# Patient Record
Sex: Male | Born: 1985 | Race: White | Hispanic: No | Marital: Single | Smoking: Former smoker
Health system: Southern US, Community
[De-identification: ages and names within clinical notes are randomized; demographics above are authoritative.]

## PROBLEM LIST (undated history)

## (undated) DIAGNOSIS — J449 Chronic obstructive pulmonary disease, unspecified: Secondary | ICD-10-CM

## (undated) DIAGNOSIS — M25562 Pain in left knee: Secondary | ICD-10-CM

## (undated) DIAGNOSIS — G8929 Other chronic pain: Secondary | ICD-10-CM

## (undated) DIAGNOSIS — M549 Dorsalgia, unspecified: Secondary | ICD-10-CM

## (undated) DIAGNOSIS — S83209A Unspecified tear of unspecified meniscus, current injury, unspecified knee, initial encounter: Secondary | ICD-10-CM

## (undated) HISTORY — PX: HAND DEBRIDEMENT: SHX974

## (undated) HISTORY — PX: TONSILLECTOMY: SUR1361

---

## 2004-10-21 HISTORY — PX: KNEE SURGERY: SHX244

## 2006-03-09 ENCOUNTER — Emergency Department (HOSPITAL_COMMUNITY): Admission: EM | Admit: 2006-03-09 | Discharge: 2006-03-09 | Payer: Self-pay | Admitting: *Deleted

## 2007-01-18 ENCOUNTER — Emergency Department (HOSPITAL_COMMUNITY): Admission: EM | Admit: 2007-01-18 | Discharge: 2007-01-18 | Payer: Self-pay | Admitting: Emergency Medicine

## 2011-08-22 ENCOUNTER — Emergency Department (HOSPITAL_COMMUNITY): Payer: Self-pay

## 2011-08-22 ENCOUNTER — Emergency Department (HOSPITAL_COMMUNITY)
Admission: EM | Admit: 2011-08-22 | Discharge: 2011-08-22 | Disposition: A | Discharge status: Home / Self Care | Payer: Self-pay | Attending: Emergency Medicine | Admitting: Emergency Medicine

## 2011-08-22 DIAGNOSIS — J449 Chronic obstructive pulmonary disease, unspecified: Secondary | ICD-10-CM | POA: Insufficient documentation

## 2011-08-22 DIAGNOSIS — R071 Chest pain on breathing: Secondary | ICD-10-CM | POA: Insufficient documentation

## 2011-08-22 DIAGNOSIS — F17200 Nicotine dependence, unspecified, uncomplicated: Secondary | ICD-10-CM | POA: Insufficient documentation

## 2011-08-22 DIAGNOSIS — J4489 Other specified chronic obstructive pulmonary disease: Secondary | ICD-10-CM | POA: Insufficient documentation

## 2011-08-22 DIAGNOSIS — R079 Chest pain, unspecified: Secondary | ICD-10-CM | POA: Insufficient documentation

## 2012-01-02 ENCOUNTER — Emergency Department (HOSPITAL_COMMUNITY): Payer: Self-pay

## 2012-01-02 ENCOUNTER — Encounter (HOSPITAL_COMMUNITY): Payer: Self-pay | Admitting: *Deleted

## 2012-01-02 ENCOUNTER — Emergency Department (HOSPITAL_COMMUNITY)
Admission: EM | Admit: 2012-01-02 | Discharge: 2012-01-02 | Disposition: A | Discharge status: Home / Self Care | Payer: Self-pay | Attending: Emergency Medicine | Admitting: Emergency Medicine

## 2012-01-02 DIAGNOSIS — M25569 Pain in unspecified knee: Secondary | ICD-10-CM | POA: Insufficient documentation

## 2012-01-02 DIAGNOSIS — F17200 Nicotine dependence, unspecified, uncomplicated: Secondary | ICD-10-CM | POA: Insufficient documentation

## 2012-01-02 MED ORDER — NAPROXEN 500 MG PO TABS: 500.0000 mg | ORAL_TABLET | Freq: Two times a day (BID) | ORAL | Status: AC

## 2012-01-02 NOTE — Discharge Instructions (Signed)
Knee Pain The knee is the complex joint between your thigh and your lower leg. It is made up of bones, tendons, ligaments, and cartilage. The bones that make up the knee are:  The femur in the thigh.   The tibia and fibula in the lower leg.   The patella or kneecap riding in the groove on the lower femur.  CAUSES  Knee pain is a common complaint with many causes. A few of these causes are:  Injury, such as:   A ruptured ligament or tendon injury.   Torn cartilage.   Medical conditions, such as:   Gout   Arthritis   Infections   Overuse, over training or overdoing a physical activity.  Knee pain can be minor or severe. Knee pain can accompany debilitating injury. Minor knee problems often respond well to self-care measures or get well on their own. More serious injuries may need medical intervention or even surgery. SYMPTOMS The knee is complex. Symptoms of knee problems can vary widely. Some of the problems are:  Pain with movement and weight bearing.   Swelling and tenderness.   Buckling of the knee.   Inability to straighten or extend your knee.   Your knee locks and you cannot straighten it.   Warmth and redness with pain and fever.   Deformity or dislocation of the kneecap.  DIAGNOSIS  Determining what is wrong may be very straight forward such as when there is an injury. It can also be challenging because of the complexity of the knee. Tests to make a diagnosis may include:  Your caregiver taking a history and doing a physical exam.   Routine X-rays can be used to rule out other problems. X-rays will not reveal a cartilage tear. Some injuries of the knee can be diagnosed by:   Arthroscopy a surgical technique by which a small video camera is inserted through tiny incisions on the sides of the knee. This procedure is used to examine and repair internal knee joint problems. Tiny instruments can be used during arthroscopy to repair the torn knee cartilage  (meniscus).   Arthrography is a radiology technique. A contrast liquid is directly injected into the knee joint. Internal structures of the knee joint then become visible on X-ray film.   An MRI scan is a non x-ray radiology procedure in which magnetic fields and a computer produce two- or three-dimensional images of the inside of the knee. Cartilage tears are often visible using an MRI scanner. MRI scans have largely replaced arthrography in diagnosing cartilage tears of the knee.   Blood work.   Examination of the fluid that helps to lubricate the knee joint (synovial fluid). This is done by taking a sample out using a needle and a syringe.  TREATMENT The treatment of knee problems depends on the cause. Some of these treatments are:  Depending on the injury, proper casting, splinting, surgery or physical therapy care will be needed.   Give yourself adequate recovery time. Do not overuse your joints. If you begin to get sore during workout routines, back off. Slow down or do fewer repetitions.   For repetitive activities such as cycling or running, maintain your strength and nutrition.   Alternate muscle groups. For example if you are a weight lifter, work the upper body on one day and the lower body the next.   Either tight or weak muscles do not give the proper support for your knee. Tight or weak muscles do not absorb the stress placed   on the knee joint. Keep the muscles surrounding the knee strong.   Take care of mechanical problems.   If you have flat feet, orthotics or special shoes may help. See your caregiver if you need help.   Arch supports, sometimes with wedges on the inner or outer aspect of the heel, can help. These can shift pressure away from the side of the knee most bothered by osteoarthritis.   A brace called an "unloader" brace also may be used to help ease the pressure on the most arthritic side of the knee.   If your caregiver has prescribed crutches, braces,  wraps or ice, use as directed. The acronym for this is PRICE. This means protection, rest, ice, compression and elevation.   Nonsteroidal anti-inflammatory drugs (NSAID's), can help relieve pain. But if taken immediately after an injury, they may actually increase swelling. Take NSAID's with food in your stomach. Stop them if you develop stomach problems. Do not take these if you have a history of ulcers, stomach pain or bleeding from the bowel. Do not take without your caregiver's approval if you have problems with fluid retention, heart failure, or kidney problems.   For ongoing knee problems, physical therapy may be helpful.   Glucosamine and chondroitin are over-the-counter dietary supplements. Both may help relieve the pain of osteoarthritis in the knee. These medicines are different from the usual anti-inflammatory drugs. Glucosamine may decrease the rate of cartilage destruction.   Injections of a corticosteroid drug into your knee joint may help reduce the symptoms of an arthritis flare-up. They may provide pain relief that lasts a few months. You may have to wait a few months between injections. The injections do have a small increased risk of infection, water retention and elevated blood sugar levels.   Hyaluronic acid injected into damaged joints may ease pain and provide lubrication. These injections may work by reducing inflammation. A series of shots may give relief for as long as 6 months.   Topical painkillers. Applying certain ointments to your skin may help relieve the pain and stiffness of osteoarthritis. Ask your pharmacist for suggestions. Many over the-counter products are approved for temporary relief of arthritis pain.   In some countries, doctors often prescribe topical NSAID's for relief of chronic conditions such as arthritis and tendinitis. A review of treatment with NSAID creams found that they worked as well as oral medications but without the serious side effects.    PREVENTION  Maintain a healthy weight. Extra pounds put more strain on your joints.   Get strong, stay limber. Weak muscles are a common cause of knee injuries. Stretching is important. Include flexibility exercises in your workouts.   Be smart about exercise. If you have osteoarthritis, chronic knee pain or recurring injuries, you may need to change the way you exercise. This does not mean you have to stop being active. If your knees ache after jogging or playing basketball, consider switching to swimming, water aerobics or other low-impact activities, at least for a few days a week. Sometimes limiting high-impact activities will provide relief.   Make sure your shoes fit well. Choose footwear that is right for your sport.   Protect your knees. Use the proper gear for knee-sensitive activities. Use kneepads when playing volleyball or laying carpet. Buckle your seat belt every time you drive. Most shattered kneecaps occur in car accidents.   Rest when you are tired.  SEEK MEDICAL CARE IF:  You have knee pain that is continual and does not   seem to be getting better.  SEEK IMMEDIATE MEDICAL CARE IF:  Your knee joint feels hot to the touch and you have a high fever. MAKE SURE YOU:   Understand these instructions.   Will watch your condition.   Will get help right away if you are not doing well or get worse.  Document Released: 08/04/2007 Document Revised: 09/26/2011 Document Reviewed: 08/04/2007 [employer] Patient Information 2012 [employer]. 

## 2012-01-02 NOTE — ED Provider Notes (Signed)
History     CSN: [id number]  Arrival date & time 01/02/12  2218   First MD Initiated Contact with Patient 01/02/12 2230      Chief Complaint  Patient presents with  . Knee Pain    (Consider location/radiation/quality/duration/timing/severity/associated sxs/prior treatment) HPI Comments: Patient c/o increasing pain to his left knee for several days.  States that he has chronic pain in his left knee due to previous injury he suffered while in the [employer].  Also states that he had surgery to repair a torn meniscus several years ago.  Pain to his left knee is worse with weightbearing. He also states that his knee feels a" unstable" and at times will not support his weight.  He states that he has been evaluated several years ago through the [place] for the same symptoms. He requests a referral to a civilian orthopedic. He denies swelling, or numbness,  to his knee.  Patient is a 26 y.o. male presenting with knee pain. The history is provided by the patient. No language interpreter was used.  Knee Pain This is a chronic problem. The current episode started in the past 7 days. The problem occurs constantly. The problem has been gradually worsening. Associated symptoms include arthralgias. Pertinent negatives include no fever, joint swelling, neck pain, numbness, swollen glands, urinary symptoms, vomiting or weakness. The symptoms are aggravated by standing, twisting and walking. He has tried nothing for the symptoms. The treatment provided no relief.    History reviewed. No pertinent past medical history.  Past Surgical History  Procedure Date  . Tonsillectomy   . Knee surgery     History reviewed. No pertinent family history.  History  Substance Use Topics  . Smoking status: Current Everyday Smoker  . Smokeless tobacco: Not on file  . Alcohol Use: Yes      Review of Systems  Constitutional: Negative for fever.  HENT: Negative for neck pain.   Gastrointestinal: Negative for vomiting.   Musculoskeletal: Positive for arthralgias. Negative for back pain and joint swelling.  Skin: Negative.   Neurological: Negative for weakness and numbness.  All other systems reviewed and are negative.    Allergies  Tramadol  Home Medications  No current outpatient prescriptions on file.  BP 134/86  Pulse 86  Temp(Src) 97.8 F (36.6 C) (Oral)  Resp 18  Ht 6\' 1"  (1.854 m)  Wt 175 lb (79.379 kg)  BMI 23.09 kg/m2  SpO2 100%  Physical Exam  Nursing note and vitals reviewed. Constitutional: He is oriented to person, place, and time. He appears well-developed and well-nourished. No distress.  HENT:  Head: Normocephalic and atraumatic.  Cardiovascular: Normal rate, regular rhythm and normal heart sounds.   Pulmonary/Chest: Effort normal and breath sounds normal. No respiratory distress.  Musculoskeletal: He exhibits tenderness. He exhibits no edema.       Left knee: He exhibits decreased range of motion and bony tenderness. He exhibits no swelling and no effusion. tenderness found. Medial joint line tenderness noted.       Legs:      Tenderness to palpation of the left medial meniscus. No erythema, effusion, or obvious deformities of the knee patient has full range of motion of the knee.  Patella tendon appears intact  Neurological: He is alert and oriented to person, place, and time. He exhibits normal muscle tone. Coordination normal.  Skin: Skin is warm and dry.    ED Course  Procedures (including critical care time)  Dg Knee Complete 4 Views Left  01/02/2012  *RADIOLOGY REPORT*  Clinical Data: Left knee pain  LEFT KNEE - COMPLETE 4+ VIEW  Comparison: None.  Findings: No acute fracture and no dislocation.  Small joint effusion.  IMPRESSION: No acute bony injury.  Original Report Authenticated By: Donavan Burnet, M.D.     Patient has a knee immobilizer at home    MDM   Chronic pain to the left knee with increasing pain for several days.  I will give him crutches and he  requests a referral for an orthopedist.  I doubt any acute injury or infectious process  Patient / Family / Caregiver understand and agree with initial ED impression and plan with expectations set for ED visit. Pt stable in ED with no significant deterioration in condition. Pt feels improved after observation and/or treatment in ED.        Stefanee Mckell L. [place], [place] 01/02/12 2333

## 2012-01-02 NOTE — ED Notes (Signed)
Pain lt knee, injury when in [employer].  Worse today.

## 2012-01-04 NOTE — ED Provider Notes (Signed)
Medical screening examination/treatment/procedure(s) were performed by non-physician practitioner and as supervising physician I was immediately available for consultation/collaboration.   Kalaysia Demonbreun L Payton Prinsen, MD 01/04/12 0725 

## 2013-01-02 DIAGNOSIS — J4489 Other specified chronic obstructive pulmonary disease: Secondary | ICD-10-CM | POA: Insufficient documentation

## 2013-01-02 DIAGNOSIS — J449 Chronic obstructive pulmonary disease, unspecified: Secondary | ICD-10-CM | POA: Insufficient documentation

## 2013-01-02 DIAGNOSIS — Z9889 Other specified postprocedural states: Secondary | ICD-10-CM | POA: Insufficient documentation

## 2013-01-02 DIAGNOSIS — M25569 Pain in unspecified knee: Secondary | ICD-10-CM | POA: Insufficient documentation

## 2013-01-02 DIAGNOSIS — F17200 Nicotine dependence, unspecified, uncomplicated: Secondary | ICD-10-CM | POA: Insufficient documentation

## 2013-01-02 DIAGNOSIS — Z87828 Personal history of other (healed) physical injury and trauma: Secondary | ICD-10-CM | POA: Insufficient documentation

## 2013-01-03 ENCOUNTER — Emergency Department (HOSPITAL_COMMUNITY): Payer: Non-veteran care

## 2013-01-03 ENCOUNTER — Encounter (HOSPITAL_COMMUNITY): Payer: Self-pay | Admitting: Emergency Medicine

## 2013-01-03 ENCOUNTER — Emergency Department (HOSPITAL_COMMUNITY)
Admission: EM | Admit: 2013-01-03 | Discharge: 2013-01-03 | Disposition: A | Discharge status: Home / Self Care | Payer: Non-veteran care | Attending: Emergency Medicine | Admitting: Emergency Medicine

## 2013-01-03 HISTORY — DX: Chronic obstructive pulmonary disease, unspecified: J44.9

## 2013-01-03 MED ORDER — HYDROCODONE-ACETAMINOPHEN 5-325 MG PO TABS: 2.0000 | ORAL_TABLET | Freq: Four times a day (QID) | ORAL | Status: DC | PRN
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
## 2013-01-04 NOTE — ED Provider Notes (Signed)
Medical screening examination/treatment/procedure(s) were performed by non-physician practitioner and as supervising physician I was immediately available for consultation/collaboration.  [name], MD 01/04/13 [phone]

## 2013-03-02 ENCOUNTER — Emergency Department (HOSPITAL_COMMUNITY)
Admission: EM | Admit: 2013-03-02 | Discharge: 2013-03-02 | Disposition: A | Discharge status: Home / Self Care | Payer: Non-veteran care | Attending: Emergency Medicine | Admitting: Emergency Medicine

## 2013-03-02 ENCOUNTER — Encounter (HOSPITAL_COMMUNITY): Payer: Self-pay

## 2013-03-02 DIAGNOSIS — M549 Dorsalgia, unspecified: Secondary | ICD-10-CM

## 2013-03-02 DIAGNOSIS — J449 Chronic obstructive pulmonary disease, unspecified: Secondary | ICD-10-CM | POA: Insufficient documentation

## 2013-03-02 DIAGNOSIS — G8929 Other chronic pain: Secondary | ICD-10-CM | POA: Insufficient documentation

## 2013-03-02 DIAGNOSIS — F17200 Nicotine dependence, unspecified, uncomplicated: Secondary | ICD-10-CM | POA: Insufficient documentation

## 2013-03-02 DIAGNOSIS — J4489 Other specified chronic obstructive pulmonary disease: Secondary | ICD-10-CM | POA: Insufficient documentation

## 2013-03-02 HISTORY — DX: Other chronic pain: G89.29

## 2013-03-02 HISTORY — DX: Dorsalgia, unspecified: M54.9

## 2013-03-02 MED ORDER — DIAZEPAM 5 MG PO TABS: 5.0000 mg | ORAL_TABLET | Freq: Two times a day (BID) | ORAL | Status: DC

## 2013-03-02 MED ORDER — PREDNISONE 20 MG PO TABS: ORAL_TABLET | ORAL | Status: DC

## 2013-03-02 MED ORDER — PREDNISONE 20 MG PO TABS
60.0000 mg | ORAL_TABLET | Freq: Once | ORAL | Status: AC
  Administered 2013-03-02: 60 mg via ORAL
  Filled 2013-03-02: qty 3

## 2013-03-02 NOTE — ED Provider Notes (Signed)
History    This chart was scribed for non-physician practitioner Johnnette Gourd working with Celene Kras, MD by Quintella Reichert, ED Scribe. This patient was seen in room WTR5/WTR5 and the patient's care was started at 6:48 PM .   CSN: [id number]  Arrival date & time 03/02/13  1836      Chief Complaint  Patient presents with  . Back Pain     The history is provided by the patient. No language interpreter was used.    HPI Comments: Dashon Mcintire is a 27 y.o. male who presents to the Emergency Department complaining of constant, gradually-worsening, moderate lower back pain that began in 2011 but became much more severe today.  Pt states he injured his back several years ago while working, and that current episode of increased pain began while pt was driving 5 hours ago.  He denies any recent traumas or falls. He states that pain is generalized throughout the lower back and describes it as a non-radiating sharp and dull pain at a severity of 7/10.  Pain is exacerbated by "anything".  He has not attempted to treat symptoms at home.  Pt denies numbness or tingling in legs, fever, chills, or any other associated symptoms.  He states pain feels similar to pain subsequent to prior injury but more severe.  Pt states he has had several flare-ups in the past several years but has not sought treatment.  He denies past h/o IV drug abuse.  Pt states that he drove himself to the ED.  Pt does not have PCP but goes to the [place] for regular healthcare.  Past Medical History  Diagnosis Date  . COPD (chronic obstructive pulmonary disease)   . Back pain, chronic     Past Surgical History  Procedure Laterality Date  . Tonsillectomy    . Knee surgery    . Hand debridement      No family history on file.  History  Substance Use Topics  . Smoking status: Current Every Day Smoker -- 1.00 packs/day for 15 years    Types: Cigarettes  . Smokeless tobacco: Former Neurosurgeon  . Alcohol Use: No      Review of  Systems  Constitutional: Negative for fever and chills.  Musculoskeletal: Positive for back pain. Negative for gait problem.  All other systems reviewed and are negative.    Allergies  Tramadol  Home Medications   Current Outpatient Rx  Name  Route  Sig  Dispense  Refill  . cyclobenzaprine (FLEXERIL) 10 MG tablet   Oral   Take 10 mg by mouth 3 (three) times daily as needed for muscle spasms.         Marland Kitchen HYDROcodone-acetaminophen (NORCO/VICODIN) 5-325 MG per tablet   Oral   Take 2 tablets by mouth every 6 (six) hours as needed for pain.   12 tablet   0   . ibuprofen (ADVIL,MOTRIN) 200 MG tablet   Oral   Take 400 mg by mouth every 6 (six) hours as needed for pain.           BP 123/76  Pulse 96  Temp(Src) 98.8 F (37.1 C)  Resp 16  SpO2 96%  Physical Exam  Nursing note and vitals reviewed. Constitutional: He is oriented to person, place, and time. He appears well-developed and well-nourished. No distress.  HENT:  Head: Normocephalic and atraumatic.  Eyes: Conjunctivae and EOM are normal.  Neck: Normal range of motion. Neck supple.  Cardiovascular: Normal rate, regular rhythm and normal  heart sounds.   Pulmonary/Chest: Effort normal and breath sounds normal.  Musculoskeletal: Normal range of motion. He exhibits tenderness. He exhibits no edema.  Tenderness throughout the entire lower back.  Neurological: He is alert and oriented to person, place, and time.  Strength 5/5 lower extremities bilaterally. Sensation intact. Normal gait.  Skin: Skin is warm and dry.  Psychiatric: He has a normal mood and affect. His behavior is normal.    ED Course  Procedures (including critical care time)  DIAGNOSTIC STUDIES: Oxygen Saturation is 96% on room air, normal by my interpretation.    COORDINATION OF CARE: 6:52 PM-Discussed treatment plan which includes prescription for muscle relaxers and prednisone with pt at bedside and pt agreed to plan. Informed pt that he will  not get a dose of muscle relaxer in the ED due to him having to drive himself home. Addressed symptoms to return for and [facility] otherwise.     Labs Reviewed - No data to display No results found.   1. Back pain       MDM  27 year old male with back pain. No red flags concerning patient's back pain. No focal neurologic deficits. Ambulating without difficulty. No signs of cauda equina. Afebrile and in no apparent distress. To place him on a short course of prednisone along with Valium. Return precautions discussed. Resource guide given for a PCP followup. Patient states understanding of plan and is agreeable.    I personally performed the services described in this documentation, which was scribed in my presence. The recorded information has been reviewed and is accurate.    Trevor Mace, PA-C 03/02/13 1906

## 2013-03-02 NOTE — ED Notes (Signed)
Pt presents with no acute distress.  Denies injury- reports "holding daughter".   Lower back pain denies numbness and tingling.

## 2013-03-03 NOTE — ED Provider Notes (Signed)
Medical screening examination/treatment/procedure(s) were performed by non-physician practitioner and as supervising physician I was immediately available for consultation/collaboration.    Tarence Searcy R Tawania Daponte, MD 03/03/13 0017 

## 2013-05-09 ENCOUNTER — Encounter (HOSPITAL_COMMUNITY): Payer: Self-pay | Admitting: Emergency Medicine

## 2013-05-09 ENCOUNTER — Emergency Department (HOSPITAL_COMMUNITY): Payer: Non-veteran care

## 2013-05-09 ENCOUNTER — Emergency Department (HOSPITAL_COMMUNITY)
Admission: EM | Admit: 2013-05-09 | Discharge: 2013-05-09 | Disposition: A | Discharge status: Home / Self Care | Payer: Non-veteran care | Attending: Emergency Medicine | Admitting: Emergency Medicine

## 2013-05-09 DIAGNOSIS — R5381 Other malaise: Secondary | ICD-10-CM

## 2013-05-09 DIAGNOSIS — R42 Dizziness and giddiness: Secondary | ICD-10-CM | POA: Insufficient documentation

## 2013-05-09 DIAGNOSIS — R5383 Other fatigue: Secondary | ICD-10-CM

## 2013-05-09 DIAGNOSIS — G8929 Other chronic pain: Secondary | ICD-10-CM | POA: Insufficient documentation

## 2013-05-09 DIAGNOSIS — M791 Myalgia, unspecified site: Secondary | ICD-10-CM

## 2013-05-09 DIAGNOSIS — R531 Weakness: Secondary | ICD-10-CM

## 2013-05-09 DIAGNOSIS — Z87891 Personal history of nicotine dependence: Secondary | ICD-10-CM | POA: Insufficient documentation

## 2013-05-09 DIAGNOSIS — M549 Dorsalgia, unspecified: Secondary | ICD-10-CM | POA: Insufficient documentation

## 2013-05-09 DIAGNOSIS — H538 Other visual disturbances: Secondary | ICD-10-CM | POA: Insufficient documentation

## 2013-05-09 DIAGNOSIS — R259 Unspecified abnormal involuntary movements: Secondary | ICD-10-CM

## 2013-05-09 DIAGNOSIS — R251 Tremor, unspecified: Secondary | ICD-10-CM

## 2013-05-09 DIAGNOSIS — J449 Chronic obstructive pulmonary disease, unspecified: Secondary | ICD-10-CM | POA: Insufficient documentation

## 2013-05-09 DIAGNOSIS — R269 Unspecified abnormalities of gait and mobility: Secondary | ICD-10-CM

## 2013-05-09 DIAGNOSIS — IMO0001 Reserved for inherently not codable concepts without codable children: Secondary | ICD-10-CM | POA: Insufficient documentation

## 2013-05-09 DIAGNOSIS — J4489 Other specified chronic obstructive pulmonary disease: Secondary | ICD-10-CM | POA: Insufficient documentation

## 2013-05-09 LAB — CBC WITH DIFFERENTIAL/PLATELET
Basophils Relative: 0 % (ref 0–1)
Eosinophils Absolute: 0.1 10*3/uL (ref 0.0–0.7)
Eosinophils Relative: 1 % (ref 0–5)
Hemoglobin: 16.2 g/dL (ref 13.0–17.0)
Lymphs Abs: 2.3 10*3/uL (ref 0.7–4.0)
MCHC: 35.8 g/dL (ref 30.0–36.0)
Monocytes Absolute: 0.5 10*3/uL (ref 0.1–1.0)
Neutro Abs: 6.2 10*3/uL (ref 1.7–7.7)
Platelets: 238 10*3/uL (ref 150–400)
RDW: 12.9 % (ref 11.5–15.5)
WBC: 9.1 10*3/uL (ref 4.0–10.5)

## 2013-05-09 LAB — POCT I-STAT, CHEM 8
BUN: 5 mg/dL — ABNORMAL LOW (ref 6–23)
Calcium, Ion: 1.17 mmol/L (ref 1.12–1.23)
Creatinine, Ser: 1 mg/dL (ref 0.50–1.35)
Hemoglobin: 16 g/dL (ref 13.0–17.0)
Sodium: 141 mEq/L (ref 135–145)
TCO2: 24 mmol/L (ref 0–100)

## 2013-05-09 LAB — RAPID URINE DRUG SCREEN, HOSP PERFORMED
Barbiturates: NOT DETECTED
Benzodiazepines: NOT DETECTED
Opiates: NOT DETECTED
Tetrahydrocannabinol: NOT DETECTED

## 2013-05-09 LAB — GLUCOSE, CAPILLARY: Glucose-Capillary: 116 mg/dL — ABNORMAL HIGH (ref 70–99)

## 2013-05-09 LAB — URINALYSIS, ROUTINE W REFLEX MICROSCOPIC
Hgb urine dipstick: NEGATIVE
Ketones, ur: NEGATIVE mg/dL
Protein, ur: NEGATIVE mg/dL

## 2013-05-09 LAB — BASIC METABOLIC PANEL
BUN: 7 mg/dL (ref 6–23)
CO2: 26 mEq/L (ref 19–32)
Calcium: 9.5 mg/dL (ref 8.4–10.5)
GFR calc non Af Amer: >90 mL/min (ref >90)
Glucose, Bld: 116 mg/dL — ABNORMAL HIGH (ref 70–99)

## 2013-05-09 MED ORDER — GADOBENATE DIMEGLUMINE 529 MG/ML IV SOLN
15.0000 mL | Freq: Once | INTRAVENOUS | Status: AC | PRN
  Administered 2013-05-09: 15 mL via INTRAVENOUS

## 2013-05-09 MED ORDER — MORPHINE SULFATE 4 MG/ML IJ SOLN
4.0000 mg | Freq: Once | INTRAMUSCULAR | Status: AC
  Administered 2013-05-09: 4 mg via INTRAVENOUS
  Filled 2013-05-09: qty 1

## 2013-05-09 NOTE — ED Provider Notes (Signed)
Complains of generalized weakness and generalized pain onset one hour ago suddenly. Also feels tremulous. Has had similar episodes in the past, etiology unclear. States he had workup for similar symptoms several years ago no cause found. Denies illicit drug use. On exam patient mildly anxious alert Glasgow Coma Score 15 gait normal Romberg normal pronator drift normal finger-nose hasn't had to tremor with left hand unable to perform her nose normal with right hand. DTRs symmetric bilaterally knee jerk ankle jerk biceps toes are red bilaterally  Doug Sou, MD 05/09/13 1539

## 2013-05-09 NOTE — ED Notes (Signed)
Pt states he was eating lunch and began to have some generalized weakness, and shaking. Pt states he had this before in 2006 and is unsure of what the cause was. Pt is alert and oriented. Denies any hx of anxiety.

## 2013-05-09 NOTE — ED Notes (Signed)
Pt c/o general weakness and shaking that started around 66mins-1hr ago. Pt states he was eating and he just began to feel weak and shake. Pt states he has had an episode like this before in 2006 and was seen in an emergency room in [place] and is unsure as to what the cause was then. Pt states he hurts all over rates pain 4/10.

## 2013-05-09 NOTE — Consult Note (Signed)
NEURO HOSPITALIST CONSULT NOTE    Reason for Consult: left sided>right sided weakness, tremors, left greater than right sided pain, imbalance  HPI:                                                                                                                                          Mitchell Mccoy is an 27 y.o. male with a past medical history significant for COPD, chronic back pain, comes in today complaining of acute onset generalized, left greater than right sided weakness and pain, unsteadiness, and bilateral upper extremity tremors. Stated that those symptoms developed abruptly around 2 pm today. No associated headache, vertigo, double vision, confusion, slurred speech, language or vision impairment. No bladder or stool incontinence. No recent fever, infection, or head trauma. Of importance, he said that he had similar but less intense symptoms in 2006 and had CT brain that was unremarkable.   Past Medical History  Diagnosis Date  . COPD (chronic obstructive pulmonary disease)   . Back pain, chronic     Past Surgical History  Procedure Laterality Date  . Tonsillectomy    . Knee surgery    . Hand debridement      No family history on file.  Family History: non contributory   Social History:  reports that he quit smoking about 2 weeks ago. His smoking use included Cigarettes. He has a 15 pack-year smoking history. He has quit using smokeless tobacco. He reports that  drinks alcohol. He reports that he does not use illicit drugs.  Allergies  Allergen Reactions  . Tramadol Swelling    MEDICATIONS:                                                                                                                     I have reviewed the patient's current medications.   ROS:  History obtained from the patient  General ROS:  negative for - chills, fatigue, fever, night sweats, weight gain or weight loss Psychological ROS: negative for - behavioral disorder, hallucinations, memory difficulties, mood swings or suicidal ideation Ophthalmic ROS: negative for - blurry vision, double vision, eye pain or loss of vision ENT ROS: negative for - epistaxis, nasal discharge, oral lesions, sore throat, tinnitus or vertigo Allergy and Immunology ROS: negative for - hives or itchy/watery eyes Hematological and Lymphatic ROS: negative for - bleeding problems, bruising or swollen lymph nodes Endocrine ROS: negative for - galactorrhea, hair pattern changes, polydipsia/polyuria or temperature intolerance Respiratory ROS: negative for - cough, hemoptysis, shortness of breath or wheezing Cardiovascular ROS: negative for - chest pain, dyspnea on exertion, edema or irregular heartbeat Gastrointestinal ROS: negative for - abdominal pain, diarrhea, hematemesis, nausea/vomiting or stool incontinence Genito-Urinary ROS: negative for - dysuria, hematuria, incontinence or urinary frequency/urgency Musculoskeletal ROS: negative for - joint swelling or muscular weakness Neurological ROS: as noted in HPI Dermatological ROS: negative for rash and skin lesion changes    Physical exam: pleasant male in no apparent distress. Blood pressure 126/76, pulse 63, temperature 98.7 F (37.1 C), temperature source Oral, resp. rate 13, SpO2 99.00%. Head: normocephalic. Neck: supple, no bruits, no JVD. Cardiac: no murmurs. Lungs: clear. Abdomen: soft, no tender, no mass. Extremities: no edema.  Neurologic Examination:                                                                                                      Mental Status: Alert, oriented, thought content appropriate.  Speech fluent without evidence of aphasia.  Able to follow 3 step commands without difficulty. Cranial Nerves: II: Discs flat bilaterally; Visual fields grossly normal, pupils  equal, round, reactive to light and accommodation III,IV, VI: ptosis not present, extra-ocular motions intact bilaterally V,VII: smile symmetric, facial light touch sensation normal bilaterally VIII: hearing normal bilaterally IX,X: gag reflex present XI: bilateral shoulder shrug XII: midline tongue extension Motor: Right : Upper extremity   5/5    Left:     Upper extremity   4+/5 but no full effort  Lower extremity   5/5     Lower extremity   5/5 Tone and bulk:normal tone throughout; no atrophy noted Sensory: Pinprick and light touch intact throughout, bilaterally Deep Tendon Reflexes:  Brisk all over  Plantars: Right: downgoing   Left: downgoing Cerebellar: normal finger-to-nose,  normal heel-to-shin test Gait: No ataxia CV: pulses palpable throughout    No results found for this basename: cbc, bmp, coags, chol, tri, ldl, hga1c    Results for orders placed during the hospital encounter of 05/09/13 (from the past 48 hour(s))  GLUCOSE, CAPILLARY     Status: Abnormal   Collection Time    05/09/13  3:25 PM      Result Value Range   Glucose-Capillary 116 (*) 70 - 99 mg/dL  POCT I-STAT, CHEM 8     Status: Abnormal   Collection Time    05/09/13  3:42 PM      Result Value Range   Sodium  141  135 - 145 mEq/L   Potassium 3.5  3.5 - 5.1 mEq/L   Chloride 103  96 - 112 mEq/L   BUN 5 (*) 6 - 23 mg/dL   Creatinine, Ser 1.30  0.50 - 1.35 mg/dL   Glucose, Bld 865 (*) 70 - 99 mg/dL   Calcium, Ion 7.84  6.96 - 1.23 mmol/L   TCO2 24  0 - 100 mmol/L   Hemoglobin 16.0  13.0 - 17.0 g/dL   HCT 29.5  28.4 - 13.2 %  CBC WITH DIFFERENTIAL     Status: None   Collection Time    05/09/13  3:52 PM      Result Value Range   WBC 9.1  4.0 - 10.5 K/uL   RBC 5.15  4.22 - 5.81 MIL/uL   Hemoglobin 16.2  13.0 - 17.0 g/dL   HCT 44.0  10.2 - 72.5 %   MCV 87.8  78.0 - 100.0 fL   MCH 31.5  26.0 - 34.0 pg   MCHC 35.8  30.0 - 36.0 g/dL   RDW 36.6  44.0 - 34.7 %   Platelets 238  150 - 400 K/uL    Neutrophils Relative % 67  43 - 77 %   Neutro Abs 6.2  1.7 - 7.7 K/uL   Lymphocytes Relative 25  12 - 46 %   Lymphs Abs 2.3  0.7 - 4.0 K/uL   Monocytes Relative 6  3 - 12 %   Monocytes Absolute 0.5  0.1 - 1.0 K/uL   Eosinophils Relative 1  0 - 5 %   Eosinophils Absolute 0.1  0.0 - 0.7 K/uL   Basophils Relative 0  0 - 1 %   Basophils Absolute 0.0  0.0 - 0.1 K/uL  BASIC METABOLIC PANEL     Status: Abnormal   Collection Time    05/09/13  3:52 PM      Result Value Range   Sodium 138  135 - 145 mEq/L   Potassium 3.4 (*) 3.5 - 5.1 mEq/L   Chloride 103  96 - 112 mEq/L   CO2 26  19 - 32 mEq/L   Glucose, Bld 116 (*) 70 - 99 mg/dL   BUN 7  6 - 23 mg/dL   Creatinine, Ser 4.25  0.50 - 1.35 mg/dL   Calcium 9.5  8.4 - 95.6 mg/dL   GFR calc non Af Amer >90  >90 mL/min   GFR calc Af Amer >90  >90 mL/min   Comment:            The eGFR has been calculated     using the CKD EPI equation.     This calculation has not been     validated in all clinical     situations.     eGFR's persistently     <90 mL/min signify     possible Chronic Kidney Disease.    No results found.   Assessment/Plan: 27 y/o with new onset generalized weakness and pain that he describes as mainly involving the left side, tremors, and imbalance. Neuro-exam with mild weakness left arm (but afraid he is not giving me full effort) and subtle bilateral postural tremors. Can not localized his syndrome to a specific segment of the neuro-axis, and thus will suggest MRI brain with and without contrast looking for the possibility of a multifocal process. He can eventually go home if MRI negative, with outpatient neurology follow up.   Wyatt Portela, MD [facility] [phone]  05/09/2013,  5:09 PM

## 2013-05-09 NOTE — ED Provider Notes (Signed)
History    CSN: [id number] Arrival date & time 05/09/13  1435  First MD Initiated Contact with Patient 05/09/13 1523     Chief Complaint  Patient presents with  . Weakness  . Shaking   (Consider location/radiation/quality/duration/timing/severity/associated sxs/prior Treatment) HPI Comments: Pt w/ hx of COPD now w/ acute onset generalized weakness, myalgias, blurred vision and tremor. States above sx began after eating dinner approx 1hr pta. Hx of similar episode in 2006 - workup at that time unremarkable. + hx of MHA - had multiple negative head CT in past. Weakness and tremor worse in left hand. Denies HA, fever, recent infection, trauma, drug abuse, ETOH. Pt is in [employer]. Generalized myalgia 4/10. Denies other sx.   Patient is a 27 y.o. male presenting with general illness. The history is provided by the patient. No language interpreter was used.  Illness Location:  CNS Quality:  Myalgia, weakness, tremor Severity:  Moderate Onset quality:  Sudden Duration:  1 hour Timing:  Constant Progression:  Worsening Chronicity:  Recurrent Associated symptoms: myalgias   Associated symptoms: no abdominal pain, no chest pain, no congestion, no cough, no diarrhea, no fever, no headaches, no nausea, no rash, no shortness of breath, no sore throat and no vomiting    Past Medical History  Diagnosis Date  . COPD (chronic obstructive pulmonary disease)   . Back pain, chronic    Past Surgical History  Procedure Laterality Date  . Tonsillectomy    . Knee surgery    . Hand debridement     No family history on file. History  Substance Use Topics  . Smoking status: Former Smoker -- 1.00 packs/day for 15 years    Types: Cigarettes    Quit date: 04/25/2013  . Smokeless tobacco: Former Neurosurgeon  . Alcohol Use: Yes     Comment: occasional     Review of Systems  Constitutional: Negative for fever and chills.  HENT: Negative for congestion and sore throat.   Eyes: Positive for visual  disturbance.  Respiratory: Negative for cough and shortness of breath.   Cardiovascular: Negative for chest pain and leg swelling.  Gastrointestinal: Negative for nausea, vomiting, abdominal pain, diarrhea and constipation.  Genitourinary: Negative for dysuria and frequency.  Musculoskeletal: Positive for myalgias.  Skin: Negative for color change and rash.  Neurological: Positive for dizziness, tremors and weakness. Negative for headaches.  Psychiatric/Behavioral: Negative for confusion and agitation.  All other systems reviewed and are negative.    Allergies  Tramadol  Home Medications   Current Outpatient Rx  Name  Route  Sig  Dispense  Refill  . ibuprofen (ADVIL,MOTRIN) 200 MG tablet   Oral   Take 400 mg by mouth every 6 (six) hours as needed for pain.          BP 127/79  Pulse 95  Temp(Src) 98.7 F (37.1 C) (Oral)  Resp 14  SpO2 100% Physical Exam  Constitutional: He is oriented to person, place, and time. He appears well-developed and well-nourished. No distress.  HENT:  Head: Normocephalic and atraumatic.  Eyes: EOM are normal. Pupils are equal, round, and reactive to light.  Neck: Normal range of motion. Neck supple.  Cardiovascular: Normal rate and regular rhythm.   Pulmonary/Chest: Effort normal. No respiratory distress.  Abdominal: Soft. He exhibits no distension.  Musculoskeletal: Normal range of motion. He exhibits no edema.  Neurological: He is alert and oriented to person, place, and time. No cranial nerve deficit or sensory deficit. He exhibits abnormal muscle tone.  He displays a negative Romberg sign. Coordination and gait normal. GCS eye subscore is 4. GCS verbal subscore is 5. GCS motor subscore is 6. He displays no Babinski's sign on the right side. He displays no Babinski's sign on the left side.  Reflex Scores:      Patellar reflexes are 2+ on the right side and 2+ on the left side.      Achilles reflexes are 2+ on the right side and 2+ on the left  side. 4/5 left grip strength Intention tremor left finger to nose Subjective blurred vision left inferior temporal vision field  Skin: Skin is warm and dry.  Psychiatric: He has a normal mood and affect. His behavior is normal.    ED Course  Procedures (including critical care time) Results for orders placed during the hospital encounter of 05/09/13  URINALYSIS, ROUTINE W REFLEX MICROSCOPIC      Result Value Range   Color, Urine YELLOW  YELLOW   APPearance CLEAR  CLEAR   Specific Gravity, Urine 1.021  1.005 - 1.030   pH 6.0  5.0 - 8.0   Glucose, UA NEGATIVE  NEGATIVE mg/dL   Hgb urine dipstick NEGATIVE  NEGATIVE   Bilirubin Urine SMALL (*) NEGATIVE   Ketones, ur NEGATIVE  NEGATIVE mg/dL   Protein, ur NEGATIVE  NEGATIVE mg/dL   Urobilinogen, UA 1.0  0.0 - 1.0 mg/dL   Nitrite NEGATIVE  NEGATIVE   Leukocytes, UA SMALL (*) NEGATIVE  CBC WITH DIFFERENTIAL      Result Value Range   WBC 9.1  4.0 - 10.5 K/uL   RBC 5.15  4.22 - 5.81 MIL/uL   Hemoglobin 16.2  13.0 - 17.0 g/dL   HCT 91.4  78.2 - 95.6 %   MCV 87.8  78.0 - 100.0 fL   MCH 31.5  26.0 - 34.0 pg   MCHC 35.8  30.0 - 36.0 g/dL   RDW 21.3  08.6 - 57.8 %   Platelets 238  150 - 400 K/uL   Neutrophils Relative % 67  43 - 77 %   Neutro Abs 6.2  1.7 - 7.7 K/uL   Lymphocytes Relative 25  12 - 46 %   Lymphs Abs 2.3  0.7 - 4.0 K/uL   Monocytes Relative 6  3 - 12 %   Monocytes Absolute 0.5  0.1 - 1.0 K/uL   Eosinophils Relative 1  0 - 5 %   Eosinophils Absolute 0.1  0.0 - 0.7 K/uL   Basophils Relative 0  0 - 1 %   Basophils Absolute 0.0  0.0 - 0.1 K/uL  BASIC METABOLIC PANEL      Result Value Range   Sodium 138  135 - 145 mEq/L   Potassium 3.4 (*) 3.5 - 5.1 mEq/L   Chloride 103  96 - 112 mEq/L   CO2 26  19 - 32 mEq/L   Glucose, Bld 116 (*) 70 - 99 mg/dL   BUN 7  6 - 23 mg/dL   Creatinine, Ser 4.69  0.50 - 1.35 mg/dL   Calcium 9.5  8.4 - 62.9 mg/dL   GFR calc non Af Amer >90  >90 mL/min   GFR calc Af Amer >90  >90 mL/min   URINE RAPID DRUG SCREEN (HOSP PERFORMED)      Result Value Range   Opiates NONE DETECTED  NONE DETECTED   Cocaine NONE DETECTED  NONE DETECTED   Benzodiazepines NONE DETECTED  NONE DETECTED   Amphetamines NONE DETECTED  NONE DETECTED  Tetrahydrocannabinol NONE DETECTED  NONE DETECTED   Barbiturates NONE DETECTED  NONE DETECTED  GLUCOSE, CAPILLARY      Result Value Range   Glucose-Capillary 116 (*) 70 - 99 mg/dL  URINE MICROSCOPIC-ADD ON      Result Value Range   Squamous Epithelial / LPF RARE  RARE   WBC, UA 3-6  <3 WBC/hpf   RBC / HPF 0-2  <3 RBC/hpf   Bacteria, UA FEW (*) RARE   Urine-Other MUCOUS PRESENT    POCT I-STAT, CHEM 8      Result Value Range   Sodium 141  135 - 145 mEq/L   Potassium 3.5  3.5 - 5.1 mEq/L   Chloride 103  96 - 112 mEq/L   BUN 5 (*) 6 - 23 mg/dL   Creatinine, Ser 9.14  0.50 - 1.35 mg/dL   Glucose, Bld 782 (*) 70 - 99 mg/dL   Calcium, Ion 9.56  2.13 - 1.23 mmol/L   TCO2 24  0 - 100 mmol/L   Hemoglobin 16.0  13.0 - 17.0 g/dL   HCT 08.6  57.8 - 46.9 %    Mr Laqueta Jean Wo Contrast  05/09/2013   *RADIOLOGY REPORT*  Clinical Data: 27 year old male with acute onset of general weakness, muscle soreness.  Unexplained shaking.  MRI HEAD WITHOUT AND WITH CONTRAST  Technique:  Multiplanar, multiecho pulse sequences of the brain and surrounding structures were obtained according to standard protocol without and with intravenous contrast  Contrast: 15mL MULTIHANCE GADOBENATE DIMEGLUMINE 529 MG/ML IV SOLN  Comparison: None.  Findings: Study is mildly degraded by motion artifact despite repeated imaging attempts.  Normal cerebral volume. No restricted diffusion to suggest acute infarction.  No midline shift, mass effect, evidence of mass lesion, ventriculomegaly, extra-axial collection or acute intracranial hemorrhage.  Cervicomedullary junction and pituitary are within normal limits.  Negative visualized cervical spine. Major intracranial vascular flow voids are preserved.  Wallace Cullens and white matter signal is within normal limits throughout the brain. No abnormal enhancement identified.  Visualized orbit soft tissues are within normal limits.  Right maxillary sinus mucous retention cyst.  Other visualized paranasal sinuses and mastoids are clear.  Small nasopharyngeal Thornwaldt cyst.  Negative scalp soft tissues. Visualized bone marrow signal is within normal limits.  IMPRESSION: Normal MRI appearance of the brain.   Original Report Authenticated By: Erskine Speed, M.D.   1. Weakness   2. Myalgia   3. Tremor     MDM  Exam as above, NAD, neg romberg and pronator drift.  4/5 strength w/ hand grip, tremor w/ left finger/nose, subjective blurred vision left inferior temporal field. Exam not c/w focal neuro lesion MR obtained to eval for multifocal lesion - scan is normal. Neurology consulted - per their note - no acute intervention needed - can follow up outpt. Labs unremarkable, UDS negative, u/a negative for infection, CBC and BMP unremarkable, glucose normal. At this time doubt CVA/TIA/SAH, infectious or metabolic etiology, hx and exam not c/w toxidrome and pt denies ingestion. Afebrile, well appearing, stable for d/c home. Follow up w/ neurology outpt. Given return precautions. D/c in good condition.   I have personally reviewed labs and imaging and considered in my MDM. Case d/w Dr Ethelda Chick  1. Weakness   2. Myalgia   3. Tremor    [facility]     [address] [place] [address]   As needed if symptoms worsen  [facility] [address]  [place] [address] [phone] Schedule an appointment as soon as possible for a visit     Audelia Hives, MD 05/10/13 [phone]

## 2013-05-09 NOTE — ED Notes (Signed)
Pt transported to MRI 

## 2013-05-09 NOTE — ED Notes (Signed)
Discharge and follow up instructions reviewed. Pt verbalized understanding.  

## 2013-05-09 NOTE — ED Notes (Signed)
Dr Hundley at bedside  

## 2013-05-10 NOTE — ED Provider Notes (Signed)
I have personally seen and examined the patient.  I have discussed the plan of care with the resident.  I have reviewed the documentation on PMH/FH/Soc. History.  I have reviewed the documentation of the resident and agree.  Sally Menard, MD 05/10/13 0148 

## 2013-05-11 LAB — URINE CULTURE: Colony Count: NO GROWTH

## 2013-08-30 ENCOUNTER — Encounter (HOSPITAL_COMMUNITY): Payer: Self-pay | Admitting: Emergency Medicine

## 2013-08-30 ENCOUNTER — Emergency Department (HOSPITAL_COMMUNITY)
Admission: EM | Admit: 2013-08-30 | Discharge: 2013-08-30 | Disposition: A | Discharge status: Home / Self Care | Payer: Non-veteran care | Attending: Emergency Medicine | Admitting: Emergency Medicine

## 2013-08-30 ENCOUNTER — Emergency Department (HOSPITAL_COMMUNITY): Payer: Non-veteran care

## 2013-08-30 DIAGNOSIS — J449 Chronic obstructive pulmonary disease, unspecified: Secondary | ICD-10-CM | POA: Insufficient documentation

## 2013-08-30 DIAGNOSIS — Z87891 Personal history of nicotine dependence: Secondary | ICD-10-CM | POA: Insufficient documentation

## 2013-08-30 DIAGNOSIS — R296 Repeated falls: Secondary | ICD-10-CM | POA: Insufficient documentation

## 2013-08-30 DIAGNOSIS — M549 Dorsalgia, unspecified: Secondary | ICD-10-CM | POA: Insufficient documentation

## 2013-08-30 DIAGNOSIS — J4489 Other specified chronic obstructive pulmonary disease: Secondary | ICD-10-CM | POA: Insufficient documentation

## 2013-08-30 DIAGNOSIS — G8929 Other chronic pain: Secondary | ICD-10-CM | POA: Insufficient documentation

## 2013-08-30 DIAGNOSIS — Y9301 Activity, walking, marching and hiking: Secondary | ICD-10-CM | POA: Insufficient documentation

## 2013-08-30 DIAGNOSIS — Y929 Unspecified place or not applicable: Secondary | ICD-10-CM | POA: Insufficient documentation

## 2013-08-30 DIAGNOSIS — R209 Unspecified disturbances of skin sensation: Secondary | ICD-10-CM | POA: Insufficient documentation

## 2013-08-30 DIAGNOSIS — IMO0002 Reserved for concepts with insufficient information to code with codable children: Secondary | ICD-10-CM | POA: Insufficient documentation

## 2013-08-30 DIAGNOSIS — S8392XA Sprain of unspecified site of left knee, initial encounter: Secondary | ICD-10-CM

## 2013-08-30 MED ORDER — NAPROXEN 500 MG PO TABS: 500.0000 mg | ORAL_TABLET | Freq: Two times a day (BID) | ORAL | Status: DC

## 2013-08-30 MED ORDER — OXYCODONE-ACETAMINOPHEN 5-325 MG PO TABS: 1.0000 | ORAL_TABLET | Freq: Three times a day (TID) | ORAL | Status: DC | PRN

## 2013-08-30 NOTE — ED Provider Notes (Signed)
CSN: [id number]     Arrival date & time 08/30/13  1952 History  This chart was scribed for Mitchell Mutton, PA-C, working with [employer]. Rubin Payor, MD, by Ardelia Mems ED Scribe. This patient was seen in room TR09C/TR09C and the patient's care was started at 10:42 PM.   Chief Complaint  Patient presents with  . Knee Injury    The history is provided by the patient. No language interpreter was used.    HPI Comments: Mitchell Mccoy is a 27 y.o. male with a history of left knee surgery who presents to the Emergency Department complaining of constant, moderate left knee pain onset after a fall that occurred earlier today. He states that while he was walking earlier today, his knees "gave out", causing him to trip and fall. He states that he fell on his back left side. He denies head injury or LOC pertaining to the fall. He states that he has a history of a knee injury with a surgery in 2006 while in the [employer]. He states that he was told he tore his Meniscus at this time. He also states that he has had intermittent paresthesias in his left foot today. He states that any movement of his left leg worsens his pain. He denies numbness or weakness to his feet, calf pain or any other pain or symptoms. He states that he has an Orthopedic consult scheduled for 09/10/13.   Past Medical History  Diagnosis Date  . COPD (chronic obstructive pulmonary disease)   . Back pain, chronic    Past Surgical History  Procedure Laterality Date  . Tonsillectomy    . Knee surgery    . Hand debridement     No family history on file. History  Substance Use Topics  . Smoking status: Former Smoker -- 1.00 packs/day for 15 years    Types: Cigarettes    Quit date: 04/25/2013  . Smokeless tobacco: Former Neurosurgeon  . Alcohol Use: Yes     Comment: occasional     Review of Systems  Musculoskeletal: Positive for arthralgias (left knee) and gait problem.  Neurological: Negative for syncope, weakness, numbness and  headaches.       Paresthesias to left foot.  All other systems reviewed and are negative.   Allergies  Tramadol  Home Medications   Current Outpatient Rx  Name  Route  Sig  Dispense  Refill  . ibuprofen (ADVIL,MOTRIN) 200 MG tablet   Oral   Take 400 mg by mouth every 6 (six) hours as needed for pain.          Triage Vitals: BP 123/80  Pulse 90  Temp(Src) 98.7 F (37.1 C)  Resp 20  Wt 176 lb 5 oz (79.975 kg)  SpO2 98%  Physical Exam  Nursing note and vitals reviewed. Constitutional: He is oriented to person, place, and time. He appears well-developed and well-nourished. No distress.  HENT:  Head: Normocephalic and atraumatic.  Neck: Normal range of motion. Neck supple.  Musculoskeletal: Normal range of motion. He exhibits tenderness.       Legs: Negative swelling, erythema, inflammation, deformities noted to the left knee. Discomfort upon palpation to the left knee circumferentially. Proper placement of patella-patella ligament intact. Positive pain upon valgus and various tension to the left knee. Negative posterior and anterior draw sign. Stable left knee joint. Mild crepitus with extension of left knee. Full flexion noted.   Neurological: He is alert and oriented to person, place, and time. He exhibits normal muscle  tone. Coordination normal.  Skin: Skin is warm and dry. No rash noted. He is not diaphoretic. No erythema.  Psychiatric: He has a normal mood and affect. His behavior is normal. Thought content normal.    ED Course  Procedures (including critical care time)  DIAGNOSTIC STUDIES: Oxygen Saturation is 98% on RA, normal by my interpretation.    COORDINATION OF CARE: 10:48 PM- Will order pain medication. Pt will be discharged with crutches and a knee sleeve. Will refer pt to Orthopedics. Advised pt to care for his knee at home with rest and ice. Discussed return precautions. Pt advised of plan for treatment and pt agrees.  Labs Review Labs Reviewed - No  data to display Imaging Review Dg Knee Complete 4 Views Left  08/30/2013   CLINICAL DATA:  Twisted knee  EXAM: LEFT KNEE - COMPLETE 4+ VIEW  COMPARISON:  None.  FINDINGS: No fracture or dislocation of the knee. No joint effusion. No significant arthropathy.  IMPRESSION: No acute osseous abnormality. .   Electronically Signed   By: Genevive Bi M.D.   On: 08/30/2013 21:02    EKG Interpretation   None       MDM   1. Knee sprain, left, initial encounter    Filed Vitals:   08/30/13 1957  BP: 123/80  Pulse: 90  Temp: 98.7 F (37.1 C)  Resp: 20  Weight: 176 lb 5 oz (79.975 kg)  SpO2: 98%   I personally performed the services described in this documentation, which was scribed in my presence. The recorded information has been reviewed and is accurate.  Patient presenting to emergency department with left knee pain. Patient reports that he had surgery back in 2006 due to meniscal injury of the left knee. Patient reports that he was experiencing discomfort the left knee, reported that his leg gave out and twisting and falling. Described the left knee discomfort to be an intermittent stabbing, shooting, throbbing, aching sensation. Alert and oriented. Negative swelling, erythema, deformities, inflammation noted to the left knee joint. Discomfort upon palpation left knee-circumferential. Negative posterior and anterior draw sign. Discomfort upon palpation to the medial and lateral collateral ligaments-positive valgus and various tension. Stable knee joint. Mild crepitus with extension of the left knee. Strength intact. Sensation intact. Pulses palpable and strong. Plain film of left knee negative for acute abnormalities. Suspicion to be more so with ligamental versus meniscal injury. Patient reports he has appointment with orthopedics on 09/10/2013-is due to get imaging performed. Discussed with patient to keep this appointment. Patient placed in the sleeve and crutches administered. Small  dose of pain medications administered to patient-discussed course, precautions, disposal technique. Patient discharged. Referred patient to orthopedics. Discussed patient to rest, ice, elevate. Discussed with patient to closely monitor symptoms and if symptoms are to worsen or change report back to emergency department-strict return instructions given.  Patient agreed to plan of care, understood, all questions answered.   Mitchell Mutton, PA-C 09/01/13 1356

## 2013-08-30 NOTE — ED Notes (Signed)
Pt. tripped while walking " knees gave out " and fell this evening , no LOC / ambulatory , reports pain at left knee.

## 2013-09-02 NOTE — ED Provider Notes (Signed)
Medical screening examination/treatment/procedure(s) were performed by non-physician practitioner and as supervising physician I was immediately available for consultation/collaboration.  EKG Interpretation   None        Tonisha Silvey R. Amybeth Sieg, MD 09/02/13 0723 

## 2013-10-10 ENCOUNTER — Emergency Department (HOSPITAL_COMMUNITY): Payer: Non-veteran care

## 2013-10-10 ENCOUNTER — Emergency Department (HOSPITAL_COMMUNITY)
Admission: EM | Admit: 2013-10-10 | Discharge: 2013-10-10 | Disposition: A | Discharge status: Home / Self Care | Payer: Non-veteran care | Attending: Emergency Medicine | Admitting: Emergency Medicine

## 2013-10-10 ENCOUNTER — Encounter (HOSPITAL_COMMUNITY): Payer: Self-pay | Admitting: Emergency Medicine

## 2013-10-10 DIAGNOSIS — Y939 Activity, unspecified: Secondary | ICD-10-CM | POA: Insufficient documentation

## 2013-10-10 DIAGNOSIS — S8990XA Unspecified injury of unspecified lower leg, initial encounter: Secondary | ICD-10-CM | POA: Diagnosis not present

## 2013-10-10 DIAGNOSIS — J449 Chronic obstructive pulmonary disease, unspecified: Secondary | ICD-10-CM | POA: Diagnosis not present

## 2013-10-10 DIAGNOSIS — X500XXA Overexertion from strenuous movement or load, initial encounter: Secondary | ICD-10-CM | POA: Diagnosis not present

## 2013-10-10 DIAGNOSIS — S8992XA Unspecified injury of left lower leg, initial encounter: Secondary | ICD-10-CM

## 2013-10-10 DIAGNOSIS — IMO0001 Reserved for inherently not codable concepts without codable children: Secondary | ICD-10-CM | POA: Diagnosis not present

## 2013-10-10 DIAGNOSIS — G8929 Other chronic pain: Secondary | ICD-10-CM | POA: Insufficient documentation

## 2013-10-10 DIAGNOSIS — Z87891 Personal history of nicotine dependence: Secondary | ICD-10-CM | POA: Insufficient documentation

## 2013-10-10 DIAGNOSIS — Y929 Unspecified place or not applicable: Secondary | ICD-10-CM | POA: Diagnosis not present

## 2013-10-10 DIAGNOSIS — J4489 Other specified chronic obstructive pulmonary disease: Secondary | ICD-10-CM | POA: Insufficient documentation

## 2013-10-10 MED ORDER — OXYCODONE-ACETAMINOPHEN 5-325 MG PO TABS: 1.0000 | ORAL_TABLET | Freq: Four times a day (QID) | ORAL | Status: DC | PRN

## 2013-10-10 NOTE — ED Provider Notes (Signed)
CSN: [id number]     Arrival date & time 10/10/13  1918 History   This chart was scribed for non-physician practitioner working Mitchell Pel, PA-C, with Mitchell Sou, MD, by Mitchell Mccoy, ED Scribe. This patient was seen in room WTR6/WTR6 and the patient's care was started at 8:30 PM. First MD Initiated Contact with Patient 10/10/13 2008     Chief Complaint  Patient presents with  . Knee Pain   The history is provided by the patient. No language interpreter was used.   HPI Comments: Mitchell Mccoy is a 27 y.o. male, with a h/o chronic back pain, who presents to the Emergency Department complaining of left knee pain which began after the pt's knee buckled and then twisted several hours ago. The pt is ambulatory, but he reports pain secondary to ambulation, and he rates the pain as 7/10.  He states that he experienced an initial knee injury in which his meniscus was torn in 2006, and he has subsequently experienced recurrent left knee pain. Last month, he also sprained the  ACL, MCL, and PCL of his left knee. He has a h/o one surgery to his left knee. [facility] Previously, he has used percocets for pain management. The pt is a former smoker.   Past Medical History  Diagnosis Date  . COPD (chronic obstructive pulmonary disease)   . Back pain, chronic    Past Surgical History  Procedure Laterality Date  . Tonsillectomy    . Knee surgery    . Hand debridement     No family history on file. History  Substance Use Topics  . Smoking status: Former Smoker -- 1.00 packs/day for 15 years    Types: Cigarettes    Quit date: 04/25/2013  . Smokeless tobacco: Former Neurosurgeon  . Alcohol Use: Yes     Comment: occasional     Review of Systems  Musculoskeletal: Positive for myalgias.  All other systems reviewed and are negative.   Allergies  Tramadol  Home Medications   Current Outpatient Rx  Name  Route  Sig  Dispense   Refill  . ibuprofen (ADVIL,MOTRIN) 200 MG tablet   Oral   Take 400 mg by mouth every 6 (six) hours as needed for pain.         Marland Kitchen oxyCODONE-acetaminophen (PERCOCET/ROXICET) 5-325 MG per tablet   Oral   Take 1 tablet by mouth every 8 (eight) hours as needed for severe pain.   11 tablet   0   . oxyCODONE-acetaminophen (PERCOCET/ROXICET) 5-325 MG per tablet   Oral   Take 1 tablet by mouth every 6 (six) hours as needed for severe pain.   15 tablet   0    Triage Vitals: BP 126/83  Pulse 105  Temp(Src) 98.3 F (36.8 C) (Oral)  Resp 18  Ht 6\' 1"  (1.854 m)  Wt 180 lb (81.647 kg)  BMI 23.75 kg/m2  SpO2 100%  Physical Exam  Nursing note and vitals reviewed. Constitutional: He is oriented to person, place, and time. He appears well-developed and well-nourished. No distress.  HENT:  Head: Normocephalic and atraumatic.  Eyes: EOM are normal.  Neck: Neck supple. No tracheal deviation present.  Cardiovascular: Normal rate.   Pulmonary/Chest: Effort normal. No respiratory distress.  Musculoskeletal: Normal range of motion. He exhibits tenderness.  Tenderness to medial side of knee. No erythema or induration. No ecchymosis. Full ROM.   Neurological: He is alert and  oriented to person, place, and time.  Skin: Skin is warm and dry.  Psychiatric: He has a normal mood and affect. His behavior is normal.    ED Course  Procedures (including critical care time)  DIAGNOSTIC STUDIES: Oxygen Saturation is 100% on room air, normal by my interpretation.    COORDINATION OF CARE:  8:35 PM- Discussed treatment plan with patient, which includes knee sleeve and pain medication, and the patient agreed to the plan.  Pt has crutches at home and requests referral to Ortho.  9:07 PM- Rechecked pt.   Labs Review Labs Reviewed - No data to display Imaging Review Dg Knee Complete 4 Views Left  10/10/2013   CLINICAL DATA:  Knee injury.  Twisted knee.  Medial knee pain.  EXAM: LEFT KNEE - COMPLETE  4+ VIEW  COMPARISON:  08/30/2013 left knee radiographs.  FINDINGS: There is no evidence of fracture, dislocation, or joint effusion. There is no evidence of arthropathy or other focal bone abnormality. Soft tissues are unremarkable.  IMPRESSION: Negative.   Electronically Signed   By: Mitchell Mccoy M.D.   On: 10/10/2013 20:55    EKG Interpretation   None       MDM   1. Knee injury, left, initial encounter    27 y.o.Mitchell Mccoy's evaluation in the Emergency Department is complete. It has been determined that no acute conditions requiring further emergency intervention are present at this time. The patient/guardian have been advised of the diagnosis and plan. We have discussed signs and symptoms that warrant return to the ED, such as changes or worsening in symptoms.  Vital signs are stable at discharge. Filed Vitals:   10/10/13 1923  BP: 126/83  Pulse: 105  Temp: 98.3 F (36.8 C)  Resp: 18    Patient/guardian has voiced understanding and agreed to follow-up with the PCP or specialist.  I personally performed the services described in this documentation, which was scribed in my presence. The recorded information has been reviewed and is accurate.     Mitchell Matas, PA-C 10/10/13 2215

## 2013-10-10 NOTE — ED Notes (Signed)
Pt c/o L knee pain, pt states pain recurrent from 2006 injury. Pt states L knee "twisted today"

## 2013-10-11 NOTE — ED Provider Notes (Signed)
Medical screening examination/treatment/procedure(s) were performed by non-physician practitioner and as supervising physician I was immediately available for consultation/collaboration.  EKG Interpretation   None        Nori Poland, MD 10/11/13 0001 

## 2014-02-20 ENCOUNTER — Emergency Department (HOSPITAL_COMMUNITY): Payer: Non-veteran care

## 2014-02-20 ENCOUNTER — Encounter (HOSPITAL_COMMUNITY): Payer: Self-pay | Admitting: Emergency Medicine

## 2014-02-20 ENCOUNTER — Emergency Department (HOSPITAL_COMMUNITY)
Admission: EM | Admit: 2014-02-20 | Discharge: 2014-02-20 | Disposition: A | Discharge status: Home / Self Care | Payer: Non-veteran care | Attending: Emergency Medicine | Admitting: Emergency Medicine

## 2014-02-20 DIAGNOSIS — R059 Cough, unspecified: Secondary | ICD-10-CM

## 2014-02-20 DIAGNOSIS — G8929 Other chronic pain: Secondary | ICD-10-CM | POA: Insufficient documentation

## 2014-02-20 DIAGNOSIS — X58XXXA Exposure to other specified factors, initial encounter: Secondary | ICD-10-CM | POA: Insufficient documentation

## 2014-02-20 DIAGNOSIS — IMO0002 Reserved for concepts with insufficient information to code with codable children: Secondary | ICD-10-CM | POA: Insufficient documentation

## 2014-02-20 DIAGNOSIS — M549 Dorsalgia, unspecified: Secondary | ICD-10-CM | POA: Insufficient documentation

## 2014-02-20 DIAGNOSIS — Y929 Unspecified place or not applicable: Secondary | ICD-10-CM | POA: Insufficient documentation

## 2014-02-20 DIAGNOSIS — M25569 Pain in unspecified knee: Secondary | ICD-10-CM | POA: Insufficient documentation

## 2014-02-20 DIAGNOSIS — R05 Cough: Secondary | ICD-10-CM | POA: Insufficient documentation

## 2014-02-20 DIAGNOSIS — J449 Chronic obstructive pulmonary disease, unspecified: Secondary | ICD-10-CM | POA: Insufficient documentation

## 2014-02-20 DIAGNOSIS — J4489 Other specified chronic obstructive pulmonary disease: Secondary | ICD-10-CM | POA: Insufficient documentation

## 2014-02-20 DIAGNOSIS — F17200 Nicotine dependence, unspecified, uncomplicated: Secondary | ICD-10-CM | POA: Insufficient documentation

## 2014-02-20 DIAGNOSIS — M25562 Pain in left knee: Secondary | ICD-10-CM

## 2014-02-20 DIAGNOSIS — Y939 Activity, unspecified: Secondary | ICD-10-CM | POA: Insufficient documentation

## 2014-02-20 HISTORY — DX: Pain in left knee: M25.562

## 2014-02-20 HISTORY — DX: Other chronic pain: G89.29

## 2014-02-20 HISTORY — DX: Unspecified tear of unspecified meniscus, current injury, unspecified knee, initial encounter: S83.209A

## 2014-02-20 MED ORDER — IBUPROFEN 800 MG PO TABS: 800.0000 mg | ORAL_TABLET | Freq: Three times a day (TID) | ORAL | Status: DC

## 2014-02-20 MED ORDER — DEXTROMETHORPHAN POLISTIREX 30 MG/5ML PO LQCR: 30.0000 mg | ORAL | Status: DC | PRN

## 2014-02-20 NOTE — Progress Notes (Signed)
Orthopedic Tech Progress Note Patient Details:  Mitchell FowlerStephen Michael Mccoy 18-Jun-1986 [id number]  Ortho Devices Type of Ortho Device: Knee Immobilizer;Crutches Ortho Device/Splint Location: LLE Ortho Device/Splint Interventions: Ordered;Application;Adjustment   Jennye MoccasinAnthony Craig Debara Kamphuis 02/20/2014, 3:00 PM

## 2014-02-20 NOTE — Discharge Instructions (Signed)
Call for a follow up appointment with a Family or Primary Care Provider.  °Return if Symptoms worsen.   °Take medication as prescribed.  ° °

## 2014-02-20 NOTE — ED Provider Notes (Signed)
CSN: [id number]     Arrival date & time 02/20/14  1139 History   This chart was scribed for non-physician practitioner, Mellody DrownLauren Donis Mccoy , working with Mitchell PoundMichael Y. Oletta LamasGhim, MD, by Mitchell Mccoy ED Scribe. This patient was seen in TR11C/TR11C and the patient's care was started at 2:10 PM.    Chief Complaint  Patient presents with  . Knee Pain    left knee  . Cough      The history is provided by the patient. No language interpreter was used.    HPI Comments: Mitchell FowlerStephen Michael Mccoy is a 28 y.o. male with h/o COPD,  with previous left knee injury who presents to the Emergency Department complaining of chronic left knee pain that worsened yesterday afternoon when he twisted his knee. Pt has seen orthopedist and has conflicting reports about whether he should have surgery on his knee.Pt is ambulatory with a limp.  [facility].  He also reports a cough that has been present for 3-4 days, the cough. He denies any fever, and congestion. Pt is a smoker, he had quit for a time this past year and has since begun again.   Past Medical History  Diagnosis Date  . COPD (chronic obstructive pulmonary disease)   . Back pain, chronic    Past Surgical History  Procedure Laterality Date  . Tonsillectomy    . Knee surgery    . Hand debridement     No family history on file. History  Substance Use Topics  . Smoking status: Former Smoker -- 1.00 packs/day for 15 years    Types: Cigarettes    Quit date: 04/25/2013  . Smokeless tobacco: Former NeurosurgeonUser  . Alcohol Use: Yes     Comment: occasional     Review of Systems  Constitutional: Negative for fever and chills.  HENT: Positive for postnasal drip. Negative for ear pain and sore throat.   Respiratory: Positive for cough.   Musculoskeletal: Positive for arthralgias and gait problem. Negative for joint swelling.  Skin: Negative for wound.  All other systems reviewed and are negative.     Allergies  Review of patient's allergies indicates no  active allergies.  Home Medications   Prior to Admission medications   Medication Sig Start Date End Date Taking? Authorizing Provider  ibuprofen (ADVIL,MOTRIN) 200 MG tablet Take 400 mg by mouth every 6 (six) hours as needed for pain.    Historical Provider, MD  oxyCODONE-acetaminophen (PERCOCET/ROXICET) 5-325 MG per tablet Take 1 tablet by mouth every 8 (eight) hours as needed for severe pain. 08/30/13   Mitchell Sciacca, PA-C  oxyCODONE-acetaminophen (PERCOCET/ROXICET) 5-325 MG per tablet Take 1 tablet by mouth every 6 (six) hours as needed for severe pain. 10/10/13   Mitchell Irine SealG Greene, PA-C   BP 130/86  Pulse 93  Resp 18  Ht 6\' 1"  (1.854 m)  Wt 190 lb (86.183 kg)  BMI 25.07 kg/m2  SpO2 100% Physical Exam  Nursing note and vitals reviewed. Constitutional: He is oriented to person, place, and time. He appears well-developed and well-nourished. No distress.  HENT:  Head: Normocephalic and atraumatic.  Left Ear: Tympanic membrane is erythematous (mild).  Mouth/Throat: No oropharyngeal exudate.  Right ear is occluded   Eyes: Pupils are equal, round, and reactive to light. Right eye exhibits no discharge. Left eye exhibits no discharge.  Neck: Normal range of motion. Neck supple.  Cardiovascular: Normal rate, regular rhythm and normal heart sounds.   Pulmonary/Chest: Effort normal and breath sounds normal.  No respiratory distress. He has no wheezes. He has no rales. He exhibits no tenderness.  Musculoskeletal: Normal range of motion.       Right shoulder: He exhibits no effusion.       Left knee: He exhibits normal range of motion, no swelling, no effusion, no ecchymosis, no deformity, no laceration, no erythema, normal alignment, no LCL laxity and no MCL laxity. Tenderness found.       Legs: No swelling of the left knee. Good active range of motion in the left knee.   Lymphadenopathy:    He has no cervical adenopathy.  Neurological: He is alert and oriented to person, place, and  time.  Skin: Skin is warm and dry. No rash noted. He is not diaphoretic.  Psychiatric: He has a normal mood and affect.    ED Course  Procedures (including critical care time)  2:15 PM-Discussed treatment plan which includes referral to a specialist, XRAY,  with pt at bedside and pt agreed to plan.   Labs Review Labs Reviewed - No data to display  Imaging Review DG Knee Complete 4 Views Left (Final result)  Result time: 02/20/14 13:17:57    Final result by Rad Results In Interface (02/20/14 13:17:57)    Narrative:   CLINICAL DATA: Pain.  EXAM: LEFT KNEE - COMPLETE 4+ VIEW  COMPARISON: None.  FINDINGS: There is no evidence of fracture, dislocation, or joint effusion. There is no evidence of arthropathy or other focal bone abnormality. Soft tissues are unremarkable.  IMPRESSION: Negative.   Electronically Signed By: Maisie Fushomas Register On: 02/20/2014 13:17             DG Chest 2 View (Final result)  Result time: 02/20/14 13:18:41    Final result by Rad Results In Interface (02/20/14 13:18:41)    Narrative:   CLINICAL DATA: Cough.  EXAM: CHEST 2 VIEW  COMPARISON: None.  FINDINGS: The heart size and mediastinal contours are within normal limits. Both lungs are clear. The visualized skeletal structures are unremarkable.  IMPRESSION: No active cardiopulmonary disease.   Electronically Signed By: Maisie Fushomas Register On: 02/20/2014 13:18           EKG Interpretation None      MDM   Final diagnoses:  Left knee pain  Cough   Pt with chronic knee pain, increased after twisting injury.  No obvious deficits on exam, likely meniscus injury. Will have the patient follow up with ortho, keen sleeve, crutches, RICE encouraged. . Patients symptoms are consistent with URI, likely viral etiology. Negative XR. Discussed that antibiotics are not indicated for viral infections. Pt will be discharged with symptomatic treatment.  Verbalizes understanding and is  agreeable with plan. Pt is hemodynamically stable & in NAD prior to dc.  Meds given in ED:  Medications - No data to display  Discharge Medication List as of 02/20/2014  2:22 PM    START taking these medications   Details  dextromethorphan (DELSYM) 30 MG/5ML liquid Take 5 mLs (30 mg total) by mouth as needed for cough (As directed on the box)., Starting 02/20/2014, Until Discontinued, Print    !! ibuprofen (ADVIL,MOTRIN) 800 MG tablet Take 1 tablet (800 mg total) by mouth 3 (three) times daily. Take with food, Starting 02/20/2014, Until Discontinued, Print     !! - Potential duplicate medications found. Please discuss with provider.      I personally performed the services described in this documentation, which was scribed in my presence. The recorded information has been reviewed and  is accurate.    Clabe Seal, PA-C 02/22/14 [phone]

## 2014-02-20 NOTE — ED Notes (Addendum)
Pt c/o left knee pain onset yesterday afternoon. Pt reports that he has prior injury to same knee in past while in [employer]. Pt ambulatory with a limp. Pt also c/o cough ongoing 3-4 days. Pt has dry cough in triage.

## 2014-02-24 NOTE — ED Provider Notes (Signed)
Medical screening examination/treatment/procedure(s) were performed by non-physician practitioner and as supervising physician I was immediately available for consultation/collaboration.  Sanav Remer Y. Launi Asencio, MD 02/24/14 2257 

## 2015-03-14 ENCOUNTER — Emergency Department (HOSPITAL_BASED_OUTPATIENT_CLINIC_OR_DEPARTMENT_OTHER)
Admission: EM | Admit: 2015-03-14 | Discharge: 2015-03-14 | Disposition: A | Discharge status: Home / Self Care | Payer: Worker's Compensation | Attending: Emergency Medicine | Admitting: Emergency Medicine

## 2015-03-14 ENCOUNTER — Encounter (HOSPITAL_BASED_OUTPATIENT_CLINIC_OR_DEPARTMENT_OTHER): Payer: Self-pay | Admitting: Emergency Medicine

## 2015-03-14 ENCOUNTER — Emergency Department (HOSPITAL_BASED_OUTPATIENT_CLINIC_OR_DEPARTMENT_OTHER): Payer: Worker's Compensation

## 2015-03-14 DIAGNOSIS — Z87891 Personal history of nicotine dependence: Secondary | ICD-10-CM | POA: Diagnosis not present

## 2015-03-14 DIAGNOSIS — X58XXXA Exposure to other specified factors, initial encounter: Secondary | ICD-10-CM | POA: Diagnosis not present

## 2015-03-14 DIAGNOSIS — Y9289 Other specified places as the place of occurrence of the external cause: Secondary | ICD-10-CM | POA: Diagnosis not present

## 2015-03-14 DIAGNOSIS — G8929 Other chronic pain: Secondary | ICD-10-CM | POA: Insufficient documentation

## 2015-03-14 DIAGNOSIS — Z87828 Personal history of other (healed) physical injury and trauma: Secondary | ICD-10-CM | POA: Insufficient documentation

## 2015-03-14 DIAGNOSIS — Y998 Other external cause status: Secondary | ICD-10-CM | POA: Insufficient documentation

## 2015-03-14 DIAGNOSIS — S63591A Other specified sprain of right wrist, initial encounter: Secondary | ICD-10-CM | POA: Diagnosis not present

## 2015-03-14 DIAGNOSIS — J449 Chronic obstructive pulmonary disease, unspecified: Secondary | ICD-10-CM | POA: Insufficient documentation

## 2015-03-14 DIAGNOSIS — Y9389 Activity, other specified: Secondary | ICD-10-CM | POA: Insufficient documentation

## 2015-03-14 DIAGNOSIS — S6991XA Unspecified injury of right wrist, hand and finger(s), initial encounter: Secondary | ICD-10-CM | POA: Diagnosis present

## 2015-03-14 MED ORDER — NAPROXEN 250 MG PO TABS
500.0000 mg | ORAL_TABLET | Freq: Once | ORAL | Status: AC
  Administered 2015-03-14: 500 mg via ORAL
  Filled 2015-03-14: qty 2

## 2015-03-14 MED ORDER — HYDROCODONE-ACETAMINOPHEN 5-325 MG PO TABS
1.0000 | ORAL_TABLET | Freq: Once | ORAL | Status: AC
  Administered 2015-03-14: 1 via ORAL
  Filled 2015-03-14: qty 1

## 2015-03-14 MED ORDER — HYDROCODONE-ACETAMINOPHEN 5-325 MG PO TABS: 1.0000 | ORAL_TABLET | Freq: Four times a day (QID) | ORAL | Status: DC | PRN

## 2015-03-14 NOTE — ED Notes (Signed)
Patient states that he was at work at about 1 am and lifted a box and felt his right wrist pop. Now has decreased ROM

## 2015-03-14 NOTE — ED Provider Notes (Signed)
CSN: [id number]     Arrival date & time 03/14/15  0235 History   First MD Initiated Contact with Patient 03/14/15 [phone]     Chief Complaint  Patient presents with  . Wrist Injury     (Consider location/radiation/quality/duration/timing/severity/associated sxs/prior Treatment) HPI  This is a 29 year old male who was at work this morning about 1 AM. He was lifting a box and he felt a pop over the ulnar aspect of the right wrist. There is now moderate to severe pain at that spot, worse with palpation or movement. Range of motion of the right wrist is limited due to pain. He is right hand is distally neurovascularly intact with no functional deficits. He denies other injury. He has not taken anything for the pain.  Past Medical History  Diagnosis Date  . COPD (chronic obstructive pulmonary disease)   . Back pain, chronic   . Chronic pain of left knee   . Meniscus tear    Past Surgical History  Procedure Laterality Date  . Tonsillectomy    . Knee surgery Left 2006    meniscus tear repair  . Hand debridement     No family history on file. History  Substance Use Topics  . Smoking status: Former Smoker -- 1.00 packs/day for 15 years    Types: Cigarettes    Quit date: 04/25/2013  . Smokeless tobacco: Former Neurosurgeon  . Alcohol Use: Yes     Comment: occasional     Review of Systems  All other systems reviewed and are negative.   Allergies  Review of patient's allergies indicates no known allergies.  Home Medications   Prior to Admission medications   Medication Sig Start Date End Date Taking? Authorizing Provider  dextromethorphan (DELSYM) 30 MG/5ML liquid Take 5 mLs (30 mg total) by mouth as needed for cough (As directed on the box). 02/20/14   Mellody Drown, PA-C  ibuprofen (ADVIL,MOTRIN) 200 MG tablet Take 400 mg by mouth every 6 (six) hours as needed for pain.    Historical Provider, MD  ibuprofen (ADVIL,MOTRIN) 800 MG tablet Take 1 tablet (800 mg total) by mouth 3 (three) times  daily. Take with food 02/20/14   Mellody Drown, PA-C  oxyCODONE-acetaminophen (PERCOCET/ROXICET) 5-325 MG per tablet Take 1 tablet by mouth every 8 (eight) hours as needed for severe pain. 08/30/13   Marissa Sciacca, PA-C  oxyCODONE-acetaminophen (PERCOCET/ROXICET) 5-325 MG per tablet Take 1 tablet by mouth every 6 (six) hours as needed for severe pain. 10/10/13   Tiffany Neva Seat, PA-C   BP 114/90 mmHg  Pulse 120  Temp(Src) 98.7 F (37.1 C) (Oral)  Resp 20  Ht  (1.854 m)  Wt 215 lb (97.523 kg)  BMI 28.37 kg/m2  SpO2 100%   Physical Exam General: Well-developed, well-nourished male in no acute distress; appearance consistent with age of record HENT: normocephalic; atraumatic Eyes: pupils equal, round and reactive to light; extraocular muscles intact Neck: supple Heart: regular rate and rhythm Lungs: clear to auscultation bilaterally Abdomen: soft; nondistended; nontender; bowel sounds present Extremities: No deformity; full range of motion; pulses normal; tenderness over right ulnar collateral ligament without laxity of the wrist joint; flexion and extension of the right wrist and fingers are intact with intact abduction and abduction of the right hand at the wrist Neurologic: Awake, alert and oriented; motor function intact in all extremities and symmetric; no facial droop Skin: Warm and dry Psychiatric: Normal mood and affect    ED Course  Procedures (including critical care time)  MDM  Nursing notes and vitals signs, including pulse oximetry, reviewed.  Summary of this visit's results, reviewed by myself:  Imaging Studies: Dg Wrist Complete Right  03/14/2015   CLINICAL DATA:  Right wrist pain after injury 2 hours ago at work. Initial encounter.  EXAM: RIGHT WRIST - COMPLETE 3+ VIEW  COMPARISON:  None.  FINDINGS: There is no evidence of fracture or dislocation. There is no evidence of arthropathy or other focal bone abnormality. Soft tissues are unremarkable.  IMPRESSION:  Negative.   Electronically Signed   By: Marnee SpringJonathon  Watts M.D.   On: 03/14/2015 03:36      Paula LibraJohn Laycie Schriner, MD 03/14/15 [id number]

## 2015-06-25 ENCOUNTER — Emergency Department (HOSPITAL_COMMUNITY): Payer: Non-veteran care

## 2015-06-25 ENCOUNTER — Encounter (HOSPITAL_COMMUNITY): Payer: Self-pay

## 2015-06-25 ENCOUNTER — Emergency Department (HOSPITAL_COMMUNITY)
Admission: EM | Admit: 2015-06-25 | Discharge: 2015-06-25 | Disposition: A | Discharge status: Home / Self Care | Payer: Non-veteran care | Attending: Emergency Medicine | Admitting: Emergency Medicine

## 2015-06-25 DIAGNOSIS — Z79899 Other long term (current) drug therapy: Secondary | ICD-10-CM | POA: Diagnosis not present

## 2015-06-25 DIAGNOSIS — X58XXXA Exposure to other specified factors, initial encounter: Secondary | ICD-10-CM | POA: Diagnosis not present

## 2015-06-25 DIAGNOSIS — S83411A Sprain of medial collateral ligament of right knee, initial encounter: Secondary | ICD-10-CM | POA: Insufficient documentation

## 2015-06-25 DIAGNOSIS — Z87891 Personal history of nicotine dependence: Secondary | ICD-10-CM | POA: Diagnosis not present

## 2015-06-25 DIAGNOSIS — Y9389 Activity, other specified: Secondary | ICD-10-CM | POA: Insufficient documentation

## 2015-06-25 DIAGNOSIS — G8929 Other chronic pain: Secondary | ICD-10-CM | POA: Diagnosis not present

## 2015-06-25 DIAGNOSIS — S8991XA Unspecified injury of right lower leg, initial encounter: Secondary | ICD-10-CM | POA: Diagnosis present

## 2015-06-25 DIAGNOSIS — J449 Chronic obstructive pulmonary disease, unspecified: Secondary | ICD-10-CM | POA: Insufficient documentation

## 2015-06-25 DIAGNOSIS — Y990 Civilian activity done for income or pay: Secondary | ICD-10-CM | POA: Diagnosis not present

## 2015-06-25 DIAGNOSIS — Y9289 Other specified places as the place of occurrence of the external cause: Secondary | ICD-10-CM | POA: Insufficient documentation

## 2015-06-25 MED ORDER — DICLOFENAC SODIUM 1 % TD GEL: 2.0000 g | Freq: Four times a day (QID) | TRANSDERMAL | Status: AC

## 2015-06-25 MED ORDER — IBUPROFEN 800 MG PO TABS: 800.0000 mg | ORAL_TABLET | Freq: Four times a day (QID) | ORAL | Status: AC

## 2015-06-25 MED ORDER — CAPSAICIN-MENTHOL-METHYL SAL 0.025-1-12 % EX CREA: 1.0000 "application " | TOPICAL_CREAM | Freq: Two times a day (BID) | CUTANEOUS | Status: AC

## 2015-06-25 NOTE — Discharge Instructions (Signed)

## 2015-06-25 NOTE — ED Notes (Signed)
He c/o right leg "stiffness" x 3 days; and he has felt intense pain with flexion of knee at medial patella margin.  He is in no distress.

## 2015-06-25 NOTE — ED Notes (Addendum)
Pt c/o pain, swelling, and "stiffness" to right knee without injury x several days.  Pt has limited ROM due to pain and swelling.

## 2015-06-25 NOTE — ED Provider Notes (Signed)
CSN: [id number]     Arrival date & time 06/25/15  1802 History   First MD Initiated Contact with Patient 06/25/15 1820     Chief Complaint  Patient presents with  . Knee Pain     (Consider location/radiation/quality/duration/timing/severity/associated sxs/prior Treatment) Patient is a 29 y.o. male presenting with knee pain. The history is provided by the patient.  Knee Pain Location:  Knee Time since incident:  1 week Injury: no   Knee location:  R knee Pain details:    Quality:  Aching and sharp   Radiates to:  Does not radiate   Severity:  Moderate   Onset quality:  Gradual   Timing:  Constant   Progression:  Worsening Chronicity:  New Dislocation: no   Foreign body present:  No foreign bodies Prior injury to area:  No Relieved by:  Nothing Worsened by:  Nothing tried Ineffective treatments:  None tried Associated symptoms: stiffness and swelling     Past Medical History  Diagnosis Date  . COPD (chronic obstructive pulmonary disease)   . Back pain, chronic   . Chronic pain of left knee   . Meniscus tear    Past Surgical History  Procedure Laterality Date  . Tonsillectomy    . Knee surgery Left 2006    meniscus tear repair  . Hand debridement     No family history on file. Social History  Substance Use Topics  . Smoking status: Former Smoker -- 1.00 packs/day for 15 years    Types: Cigarettes    Quit date: 04/25/2013  . Smokeless tobacco: Former Neurosurgeon  . Alcohol Use: Yes     Comment: occasional     Review of Systems  Musculoskeletal: Positive for stiffness.  All other systems reviewed and are negative.     Allergies  Review of patient's allergies indicates no known allergies.  Home Medications   Prior to Admission medications   Medication Sig Start Date End Date Taking? Authorizing Provider  Capsaicin-Menthol-Methyl Sal (CAPSAICIN-METHYL SAL-MENTHOL) 0.025-1-12 % CREA Apply 1 application topically 2 (two) times daily. 06/25/15   Lyndal Pulley, MD   diclofenac sodium (VOLTAREN) 1 % GEL Apply 2 g topically 4 (four) times daily. 06/25/15   Lyndal Pulley, MD  HYDROcodone-acetaminophen (NORCO/VICODIN) 5-325 MG per tablet Take 1-2 tablets by mouth every 6 (six) hours as needed (for pain). Patient not taking: Reported on 06/25/2015 03/14/15   Paula Libra, MD  ibuprofen (ADVIL,MOTRIN) 800 MG tablet Take 1 tablet (800 mg total) by mouth every 6 (six) hours. 06/25/15   Lyndal Pulley, MD   BP 115/72 mmHg  Pulse 67  Temp(Src) 97.8 F (36.6 C) (Oral)  Resp 16  SpO2 96% Physical Exam  Constitutional: He is oriented to person, place, and time. He appears well-developed and well-nourished. No distress.  HENT:  Head: Normocephalic and atraumatic.  Eyes: Conjunctivae are normal.  Neck: Neck supple. No tracheal deviation present.  Cardiovascular: Normal rate and regular rhythm.   Pulmonary/Chest: Effort normal. No respiratory distress.  Abdominal: Soft. He exhibits no distension.  Musculoskeletal:       Right knee: He exhibits swelling. He exhibits normal range of motion, no deformity, no LCL laxity, normal patellar mobility and no MCL laxity. Tenderness found. MCL tenderness noted.  ACL/PCL testing in tact  Neurological: He is alert and oriented to person, place, and time.  Skin: Skin is warm and dry.  Psychiatric: He has a normal mood and affect.    ED Course  Procedures (including critical care time)  Labs Review Labs Reviewed - No data to display  Imaging Review Dg Knee Complete 4 Views Right  06/25/2015   CLINICAL DATA:  Knee pain and stiffness for 3 days.  EXAM: RIGHT KNEE - COMPLETE 4+ VIEW  COMPARISON:  None.  FINDINGS: There is no evidence of fracture or dislocation. There is a small joint effusion. There is no evidence of arthropathy or other focal bone abnormality. Soft tissues are unremarkable.  IMPRESSION: No acute osseous injury of the right knee.   Electronically Signed   By: Elige Ko   On: 06/25/2015 19:03   I have personally  reviewed and evaluated these images and lab results as part of my medical decision-making.   EKG Interpretation None      MDM   Final diagnoses:  Knee MCL sprain, right, initial encounter   29 y.o. male presents with right knee pain with insidious onset. Has had overuse at work with lifting boxes at [employer]. Distribution c/w MCL sprain, no acute fracture, in tact to confrontation and do not suspect tear. Pt given instructions for supportive care including NSAIDs, rest, ice, compression, and elevation to help alleviate symptoms. Plan to follow up with PCP as needed and return precautions discussed for worsening or new concerning symptoms.     Lyndal Pulley, MD 06/26/15 [phone]

## 2015-09-11 ENCOUNTER — Encounter (HOSPITAL_BASED_OUTPATIENT_CLINIC_OR_DEPARTMENT_OTHER): Payer: Self-pay | Admitting: Emergency Medicine

## 2015-09-11 ENCOUNTER — Emergency Department (HOSPITAL_BASED_OUTPATIENT_CLINIC_OR_DEPARTMENT_OTHER): Payer: Non-veteran care

## 2015-09-11 ENCOUNTER — Emergency Department (HOSPITAL_BASED_OUTPATIENT_CLINIC_OR_DEPARTMENT_OTHER)
Admission: EM | Admit: 2015-09-11 | Discharge: 2015-09-11 | Disposition: A | Discharge status: Home / Self Care | Payer: Non-veteran care | Attending: Emergency Medicine | Admitting: Emergency Medicine

## 2015-09-11 DIAGNOSIS — Y9389 Activity, other specified: Secondary | ICD-10-CM | POA: Insufficient documentation

## 2015-09-11 DIAGNOSIS — Y9289 Other specified places as the place of occurrence of the external cause: Secondary | ICD-10-CM | POA: Insufficient documentation

## 2015-09-11 DIAGNOSIS — Y998 Other external cause status: Secondary | ICD-10-CM | POA: Insufficient documentation

## 2015-09-11 DIAGNOSIS — Z87891 Personal history of nicotine dependence: Secondary | ICD-10-CM | POA: Insufficient documentation

## 2015-09-11 DIAGNOSIS — W228XXA Striking against or struck by other objects, initial encounter: Secondary | ICD-10-CM | POA: Insufficient documentation

## 2015-09-11 DIAGNOSIS — J449 Chronic obstructive pulmonary disease, unspecified: Secondary | ICD-10-CM | POA: Insufficient documentation

## 2015-09-11 DIAGNOSIS — Z79899 Other long term (current) drug therapy: Secondary | ICD-10-CM | POA: Insufficient documentation

## 2015-09-11 DIAGNOSIS — S60221A Contusion of right hand, initial encounter: Secondary | ICD-10-CM | POA: Insufficient documentation

## 2015-09-11 DIAGNOSIS — Z791 Long term (current) use of non-steroidal anti-inflammatories (NSAID): Secondary | ICD-10-CM | POA: Insufficient documentation

## 2015-09-11 DIAGNOSIS — G8929 Other chronic pain: Secondary | ICD-10-CM | POA: Insufficient documentation

## 2015-09-11 MED ORDER — HYDROCODONE-ACETAMINOPHEN 5-325 MG PO TABS: 1.0000 | ORAL_TABLET | Freq: Four times a day (QID) | ORAL | Status: AC | PRN

## 2015-09-11 NOTE — ED Notes (Signed)
Patient states that he punched a box at work on wed. Still has pain to his right hand

## 2015-09-11 NOTE — ED Provider Notes (Signed)
CSN: [id number]     Arrival date & time 10/07/2015  0114 History   First MD Initiated Contact with Patient 10/07/15 0138     Chief Complaint  Patient presents with  . Hand Injury     (Consider location/radiation/quality/duration/timing/severity/associated sxs/prior Treatment) HPI  This is a 29 year old male who punched a box at work with his right fist 5 days ago. He has persistent pain overlying the right fifth metacarpal. There is no associated deformity or functional deficit. He rates his pain as 6 out of 10 at rest and 8 out of 10 at its worst. Pain is worse with movement or palpation.  Past Medical History  Diagnosis Date  . COPD (chronic obstructive pulmonary disease) (HCC)   . Back pain, chronic   . Chronic pain of left knee   . Meniscus tear    Past Surgical History  Procedure Laterality Date  . Tonsillectomy    . Knee surgery Left 2006    meniscus tear repair  . Hand debridement     History reviewed. No pertinent family history. Social History  Substance Use Topics  . Smoking status: Former Smoker -- 1.00 packs/day for 15 years    Types: Cigarettes    Quit date: 04/25/2013  . Smokeless tobacco: Former Neurosurgeon  . Alcohol Use: Yes     Comment: occasional     Review of Systems  All other systems reviewed and are negative.   Allergies  Review of patient's allergies indicates no known allergies.  Home Medications   Prior to Admission medications   Medication Sig Start Date End Date Taking? Authorizing Provider  Capsaicin-Menthol-Methyl Sal (CAPSAICIN-METHYL SAL-MENTHOL) 0.025-1-12 % CREA Apply 1 application topically 2 (two) times daily. 06/25/15   Lyndal Pulley, MD  diclofenac sodium (VOLTAREN) 1 % GEL Apply 2 g topically 4 (four) times daily. 06/25/15   Lyndal Pulley, MD  HYDROcodone-acetaminophen (NORCO/VICODIN) 5-325 MG per tablet Take 1-2 tablets by mouth every 6 (six) hours as needed (for pain). Patient not taking: Reported on 06/25/2015 03/14/15   Paula Libra, MD   ibuprofen (ADVIL,MOTRIN) 800 MG tablet Take 1 tablet (800 mg total) by mouth every 6 (six) hours. 06/25/15   Lyndal Pulley, MD   BP 132/96 mmHg  Pulse 83  Temp(Src) 98.3 F (36.8 C) (Oral)  Resp 16  Ht  (1.854 m)  Wt 190 lb (86.183 kg)  BMI 25.07 kg/m2  SpO2 100%   Physical Exam  General: Well-developed, well-nourished male in no acute distress; appearance consistent with age of record HENT: normocephalic; atraumatic Eyes: pupils equal, round and reactive to light; extraocular muscles intact Neck: supple Heart: regular rate and rhythm Lungs: clear to auscultation bilaterally Abdomen: soft; nondistended; nontender Extremities: No deformity; full range of motion; pulses normal; tenderness over right fifth metacarpal without deformity, swelling or ecchymosis Neurologic: Awake, alert and oriented; motor function intact in all extremities and symmetric; no facial droop Skin: Warm and dry Psychiatric: Normal mood and affect    ED Course  Procedures (including critical care time)   MDM  Nursing notes and vitals signs, including pulse oximetry, reviewed.  Summary of this visit's results, reviewed by myself:  Imaging Studies: Dg Hand Complete Right  Oct 07, 2015  CLINICAL DATA:  Punched a box at work, with right hand pain. Initial encounter. EXAM: RIGHT HAND - COMPLETE 3+ VIEW COMPARISON:  Right wrist radiographs performed 03/14/2015 FINDINGS: There is no evidence of fracture or dislocation. The joint spaces are preserved. The carpal rows are intact, and demonstrate normal  alignment. Mild dorsal soft tissue swelling is noted. IMPRESSION: No evidence of fracture or dislocation. Electronically Signed   By: Roanna RaiderJeffery  Chang M.D.   On: 09/11/2015 01:37      Paula LibraJohn Kenwood Rosiak, MD 09/11/15 [phone]

## 2015-09-11 NOTE — Discharge Instructions (Signed)

## 2015-09-11 NOTE — ED Notes (Signed)
Patient transported to X-ray 

## 2016-01-25 ENCOUNTER — Emergency Department (HOSPITAL_BASED_OUTPATIENT_CLINIC_OR_DEPARTMENT_OTHER)
Admission: EM | Admit: 2016-01-25 | Discharge: 2016-01-25 | Disposition: A | Discharge status: Home / Self Care | Payer: BLUE CROSS/BLUE SHIELD | Attending: Emergency Medicine | Admitting: Emergency Medicine

## 2016-01-25 ENCOUNTER — Emergency Department (HOSPITAL_BASED_OUTPATIENT_CLINIC_OR_DEPARTMENT_OTHER): Payer: BLUE CROSS/BLUE SHIELD

## 2016-01-25 ENCOUNTER — Encounter (HOSPITAL_BASED_OUTPATIENT_CLINIC_OR_DEPARTMENT_OTHER): Payer: Self-pay

## 2016-01-25 DIAGNOSIS — Z791 Long term (current) use of non-steroidal anti-inflammatories (NSAID): Secondary | ICD-10-CM | POA: Insufficient documentation

## 2016-01-25 DIAGNOSIS — J449 Chronic obstructive pulmonary disease, unspecified: Secondary | ICD-10-CM | POA: Diagnosis not present

## 2016-01-25 DIAGNOSIS — R55 Syncope and collapse: Secondary | ICD-10-CM | POA: Insufficient documentation

## 2016-01-25 DIAGNOSIS — R51 Headache: Secondary | ICD-10-CM | POA: Diagnosis present

## 2016-01-25 DIAGNOSIS — R251 Tremor, unspecified: Secondary | ICD-10-CM | POA: Diagnosis not present

## 2016-01-25 DIAGNOSIS — Z87891 Personal history of nicotine dependence: Secondary | ICD-10-CM | POA: Diagnosis not present

## 2016-01-25 DIAGNOSIS — H53149 Visual discomfort, unspecified: Secondary | ICD-10-CM | POA: Diagnosis not present

## 2016-01-25 DIAGNOSIS — Z79899 Other long term (current) drug therapy: Secondary | ICD-10-CM | POA: Diagnosis not present

## 2016-01-25 DIAGNOSIS — G43009 Migraine without aura, not intractable, without status migrainosus: Secondary | ICD-10-CM | POA: Insufficient documentation

## 2016-01-25 DIAGNOSIS — G8929 Other chronic pain: Secondary | ICD-10-CM | POA: Insufficient documentation

## 2016-01-25 LAB — CBC WITH DIFFERENTIAL/PLATELET
BASOS PCT: 1 %
Basophils Absolute: 0 10*3/uL (ref 0.0–0.1)
EOS ABS: 0.1 10*3/uL (ref 0.0–0.7)
EOS PCT: 2 %
HCT: 47 % (ref 39.0–52.0)
HEMOGLOBIN: 16.9 g/dL (ref 13.0–17.0)
LYMPHS ABS: 2.6 10*3/uL (ref 0.7–4.0)
Lymphocytes Relative: 30 %
MCH: 31.1 pg (ref 26.0–34.0)
MCHC: 36 g/dL (ref 30.0–36.0)
MCV: 86.4 fL (ref 78.0–100.0)
MONOS PCT: 5 %
Monocytes Absolute: 0.4 10*3/uL (ref 0.1–1.0)
NEUTROS PCT: 62 %
Neutro Abs: 5.5 10*3/uL (ref 1.7–7.7)
PLATELETS: 299 10*3/uL (ref 150–400)
RBC: 5.44 MIL/uL (ref 4.22–5.81)
RDW: 12.8 % (ref 11.5–15.5)
WBC: 8.7 10*3/uL (ref 4.0–10.5)

## 2016-01-25 LAB — RAPID URINE DRUG SCREEN, HOSP PERFORMED
Amphetamines: NOT DETECTED
Barbiturates: NOT DETECTED
Benzodiazepines: NOT DETECTED
COCAINE: NOT DETECTED
OPIATES: NOT DETECTED
TETRAHYDROCANNABINOL: NOT DETECTED

## 2016-01-25 LAB — BASIC METABOLIC PANEL
ANION GAP: 13 (ref 5–15)
BUN: 11 mg/dL (ref 6–20)
CHLORIDE: 103 mmol/L (ref 101–111)
CO2: 23 mmol/L (ref 22–32)
Calcium: 9.4 mg/dL (ref 8.9–10.3)
Creatinine, Ser: 0.96 mg/dL (ref 0.61–1.24)
Glucose, Bld: 102 mg/dL — ABNORMAL HIGH (ref 65–99)
Potassium: 3.5 mmol/L (ref 3.5–5.1)
Sodium: 139 mmol/L (ref 135–145)

## 2016-01-25 LAB — URINALYSIS, ROUTINE W REFLEX MICROSCOPIC
GLUCOSE, UA: NEGATIVE mg/dL
Hgb urine dipstick: NEGATIVE
KETONES UR: 40 mg/dL — AB
LEUKOCYTES UA: NEGATIVE
Nitrite: NEGATIVE
PH: 7.5 (ref 5.0–8.0)
Protein, ur: 30 mg/dL — AB
SPECIFIC GRAVITY, URINE: 1.026 (ref 1.005–1.030)

## 2016-01-25 LAB — URINE MICROSCOPIC-ADD ON

## 2016-01-25 LAB — CBG MONITORING, ED: Glucose-Capillary: 100 mg/dL — ABNORMAL HIGH (ref 65–99)

## 2016-01-25 MED ORDER — DIPHENHYDRAMINE HCL 50 MG/ML IJ SOLN
25.0000 mg | Freq: Once | INTRAMUSCULAR | Status: AC
  Administered 2016-01-25: 25 mg via INTRAVENOUS
  Filled 2016-01-25: qty 1

## 2016-01-25 MED ORDER — SODIUM CHLORIDE 0.9 % IV BOLUS (SEPSIS)
1000.0000 mL | Freq: Once | INTRAVENOUS | Status: AC
  Administered 2016-01-25: 1000 mL via INTRAVENOUS

## 2016-01-25 MED ORDER — KETOROLAC TROMETHAMINE 30 MG/ML IJ SOLN: 30.0000 mg | Freq: Once | INTRAMUSCULAR | Status: DC

## 2016-01-25 MED ORDER — METOCLOPRAMIDE HCL 5 MG/ML IJ SOLN
5.0000 mg | Freq: Once | INTRAMUSCULAR | Status: AC
  Administered 2016-01-25: 5 mg via INTRAVENOUS
  Filled 2016-01-25: qty 2

## 2016-01-25 MED ORDER — LIDOCAINE HCL (PF) 1 % IJ SOLN
INTRAMUSCULAR | Status: AC
  Administered 2016-01-25: 5 mL
  Filled 2016-01-25: qty 5

## 2016-01-25 MED ORDER — KETOROLAC TROMETHAMINE 30 MG/ML IJ SOLN
30.0000 mg | Freq: Once | INTRAMUSCULAR | Status: AC
Start: 2016-01-25 — End: 2016-01-25
  Administered 2016-01-25: 30 mg via INTRAVENOUS
  Filled 2016-01-25: qty 1

## 2016-01-25 MED ORDER — DEXAMETHASONE SODIUM PHOSPHATE 10 MG/ML IJ SOLN
10.0000 mg | Freq: Once | INTRAMUSCULAR | Status: AC
  Administered 2016-01-25: 10 mg via INTRAVENOUS
  Filled 2016-01-25: qty 1

## 2016-01-25 NOTE — ED Notes (Signed)
Patient is alert and oriented x3.  He was given DC instructions and follow up visit instructions.  Patient gave verbal understanding.  He was DC ambulatory under his own power to home.  V/S stable.  He was not showing any signs of distress on DC 

## 2016-01-25 NOTE — ED Notes (Signed)
C/o ha x 2 days  ? Nausea,  Shakes

## 2016-01-25 NOTE — ED Notes (Signed)
Patient stable.  No vomiting noted after PO challenge

## 2016-01-25 NOTE — ED Provider Notes (Signed)
8:15 AM patient was able to drink and eat without vomiting. He states he feels better. He is alert Glasgow Coma Score 15 and appears in no distress  Doug SouSam Ahmari Garton, MD 01/25/16 [phone]

## 2016-01-25 NOTE — ED Provider Notes (Signed)
CSN: [id number]     Arrival date & time 01/25/16  0448 History   First MD Initiated Contact with Patient 01/25/16 0518     Chief Complaint  Patient presents with  . Headache     (Consider location/radiation/quality/duration/timing/severity/associated sxs/prior Treatment) HPI  This is a 30 year old male with a history of migraines who presents with headache and "shakes." She reports 2 day history of worsening shaking episodes and headache. He has a history of migraines and states the character of this pain is the same. Headache has been worsening over the last 2 days.Gradually worsening. Current pain is 9 out of 10. He denies any episodes of vomiting but does endorse nausea. Patient reports that he has not had to come to the hospital for migraine since he was in middle school. He states "I had migraines every day in middle school." States that he's been very shaky at work for the last 2 nights. He was sent home last night. States he has not been eating and drinking well over the last 2 weeks because "I feel depressed." Reports that he recently lost his grandmother. A friend who is with him states that he stood up and "passed out on the bed for 2-3 minutes." Patient reports that he had one prior episode of similar loss of consciousness.  Denies chest pain or shortness of breath. Denies worst headache of his life. Denies vision changes, weakness, numbness, tingling. Denies fever, neck pain, neck stiffness. Does report photophobia. Denies drug or alcohol abuse.  Past Medical History  Diagnosis Date  . COPD (chronic obstructive pulmonary disease) (HCC)   . Back pain, chronic   . Chronic pain of left knee   . Meniscus tear    Past Surgical History  Procedure Laterality Date  . Tonsillectomy    . Knee surgery Left 2006    meniscus tear repair  . Hand debridement     No family history on file. Social History  Substance Use Topics  . Smoking status: Former Smoker -- 1.00 packs/day for 15 years     Types: Cigarettes    Quit date: 04/25/2013  . Smokeless tobacco: Former Neurosurgeon  . Alcohol Use: Yes     Comment: occasional     Review of Systems  Constitutional: Negative.  Negative for fever.  Eyes: Positive for photophobia. Negative for visual disturbance.  Respiratory: Negative.  Negative for chest tightness and shortness of breath.   Cardiovascular: Negative.  Negative for chest pain.  Gastrointestinal: Negative.  Negative for nausea, vomiting and abdominal pain.  Genitourinary: Negative.  Negative for dysuria.  Skin: Negative for rash.  Neurological: Positive for tremors, syncope and headaches. Negative for dizziness and weakness.  All other systems reviewed and are negative.     Allergies  Review of patient's allergies indicates no known allergies.  Home Medications   Prior to Admission medications   Medication Sig Start Date End Date Taking? Authorizing Provider  Capsaicin-Menthol-Methyl Sal (CAPSAICIN-METHYL SAL-MENTHOL) 0.025-1-12 % CREA Apply 1 application topically 2 (two) times daily. 06/25/15   Lyndal Pulley, MD  diclofenac sodium (VOLTAREN) 1 % GEL Apply 2 g topically 4 (four) times daily. 06/25/15   Lyndal Pulley, MD  HYDROcodone-acetaminophen (NORCO/VICODIN) 5-325 MG tablet Take 1-2 tablets by mouth every 6 (six) hours as needed (for pain). 09/11/15   John Molpus, MD  ibuprofen (ADVIL,MOTRIN) 800 MG tablet Take 1 tablet (800 mg total) by mouth every 6 (six) hours. 06/25/15   Lyndal Pulley, MD   BP 118/75 mmHg  Pulse  91  Temp(Src) 98.1 F (36.7 C) (Oral)  Resp 18  Ht 6\' 1"  (1.854 m)  Wt 188 lb (85.276 kg)  BMI 24.81 kg/m2  SpO2 98% Physical Exam  Constitutional: He is oriented to person, place, and time. He appears well-developed and well-nourished. No distress.  HENT:  Head: Normocephalic and atraumatic.  Mouth/Throat: Oropharynx is clear and moist.  Eyes: EOM are normal. Pupils are equal, round, and reactive to light.  Neck: Normal range of motion. Neck  supple.  Cardiovascular: Normal rate, regular rhythm and normal heart sounds.   No murmur heard. Pulmonary/Chest: Effort normal and breath sounds normal. No respiratory distress. He has no wheezes.  Abdominal: Soft. Bowel sounds are normal. There is no tenderness. There is no rebound.  Musculoskeletal: He exhibits no edema.  Neurological: He is alert and oriented to person, place, and time.  Cranial nerves II through XII intact, 5 out of 5 strength in all 4 extremities, no dysmetria to finger-nose-finger  Skin: Skin is warm and dry.  Psychiatric: He has a normal mood and affect.  Nursing note and vitals reviewed.   ED Course  Procedures (including critical care time) Labs Review Labs Reviewed  BASIC METABOLIC PANEL - Abnormal; Notable for the following:    Glucose, Bld 102 (*)    All other components within normal limits  URINALYSIS, ROUTINE W REFLEX MICROSCOPIC (NOT AT [facility]) - Abnormal; Notable for the following:    Color, Urine AMBER (*)    APPearance CLOUDY (*)    Bilirubin Urine SMALL (*)    Ketones, ur 40 (*)    Protein, ur 30 (*)    All other components within normal limits  URINE MICROSCOPIC-ADD ON - Abnormal; Notable for the following:    Squamous Epithelial / LPF 0-5 (*)    Bacteria, UA RARE (*)    All other components within normal limits  CBG MONITORING, ED - Abnormal; Notable for the following:    Glucose-Capillary 100 (*)    All other components within normal limits  CBC WITH DIFFERENTIAL/PLATELET  URINE RAPID DRUG SCREEN, HOSP PERFORMED    Imaging Review Ct Head Wo Contrast  01/25/2016  CLINICAL DATA:  Syncope.  Severe frontal headache. EXAM: CT HEAD WITHOUT CONTRAST TECHNIQUE: Contiguous axial images were obtained from the base of the skull through the vertex without intravenous contrast. COMPARISON:  Brain MRI 05/09/2013 FINDINGS: Brain: No evidence of acute infarction, hemorrhage, extra-axial collection, ventriculomegaly, or mass effect. Vascular: No hyperdense  vessel or unexpected calcification. Skull: Negative for fracture or focal lesion. Sinuses/Orbits: Minimal mucosal thickening left side of sphenoid sinus. Paranasal sinuses otherwise clear. Other: None. IMPRESSION: No acute intracranial abnormality. Electronically Signed   By: Rubye OaksMelanie  Ehinger M.D.   On: 01/25/2016 06:54   I have personally reviewed and evaluated these images and lab results as part of my medical decision-making.   EKG Interpretation   Date/Time:  Thursday January 25 2016 05:32:50 EDT Ventricular Rate:  87 PR Interval:  162 QRS Duration: 91 QT Interval:  349 QTC Calculation: 420 R Axis:   55 Text Interpretation:  Sinus rhythm Early repolarization Confirmed by  Norris Bodley  MD, Darthy Manganelli ([id number]) on 01/25/2016 6:22:57 AM      MDM   Final diagnoses:  Migraine without aura and without status migrainosus, not intractable  Tremulousness    Patient presents with 2 day history of headache, tremulousness, and nausea. Also reports one episode of syncope tonight. Was not associated with chest pain or dizziness. He is nontoxic and nonfocal  on exam. He has a history of migraines but has not had to have treatment in many years. He does report recent decreased by mouth intake. Basic labwork obtained. Urinalysis concerning for mild dehydration with 40 ketones in the urine. EKG is nonischemic and without evidence of arrhythmia. He does have a history of migraines but given the syncopal episode, subarachnoid hemorrhage would also be a consideration. The character of the headache is consistent with his primary migraines. I discussed the differential with the patient. CT head is negative; however, given the duration of the symptoms, he would need a lumbar puncture to rule out subarachnoid hemorrhage. Patient was consented for this. 2 attempts were made; however, were unsuccessful. I discussed with patient transfer to [facility] for an interventional radiology lumbar puncture. Patient states "I don't want  to have that done." He he understands the risk and benefits of the referring further workup. I am reassured by a negative CT scan and have low clinical suspicion for subarachnoid given the description and history of migraines. I will not make him sign out AGAINST MEDICAL ADVICE. He does report some clinical improvement in his headache with migraine cocktail. Her pain is 5 out of 10. His family is at the bedside and also understand the risk and benefits. He was encouraged if he has worsening of his symptoms he needs to be reevaluated immediately at [facility] ED.  Patient was able to tolerate fluids.    Shon Baton, MD 01/25/16 [phone]

## 2016-01-25 NOTE — ED Notes (Signed)
Patient given Crackers and water.

## 2016-01-25 NOTE — ED Notes (Signed)
Pt c/o having the shakes and a headache x2 days, states hasn't ate x2days, states tonight he passed out across the bed for 2-3 mins. No distress noted

## 2016-01-25 NOTE — Discharge Instructions (Signed)
You were seen today for headache and loss of concsiousness.  You have a history of migraines but has not had a migraine in some time. The description of your headache is consistent with migraine; however, given the episode of loss of consciousness, subarachnoid hemorrhage was also considered. Your CT scan is reassuring but this does not 100% rule out subarachnoid hemorrhage. Lumbar puncture in the emergency department was not successful. You declined going for a radiology guided lumbar puncture.  Your headache was improving. However, a few are headache acutely worsens, you need to be reevaluated immediately. You should go to [facility] emergency department.    Migraine Headache A migraine headache is an intense, throbbing pain on one or both sides of your head. A migraine can last for 30 minutes to several hours. CAUSES  The exact cause of a migraine headache is not always known. However, a migraine may be caused when nerves in the brain become irritated and release chemicals that cause inflammation. This causes pain. Certain things may also trigger migraines, such as:  Alcohol.  Smoking.  Stress.  Menstruation.  Aged cheeses.  Foods or drinks that contain nitrates, glutamate, aspartame, or tyramine.  Lack of sleep.  Chocolate.  Caffeine.  Hunger.  Physical exertion.  Fatigue.  Medicines used to treat chest pain (nitroglycerine), birth control pills, estrogen, and some blood pressure medicines. SIGNS AND SYMPTOMS  Pain on one or both sides of your head.  Pulsating or throbbing pain.  Severe pain that prevents daily activities.  Pain that is aggravated by any physical activity.  Nausea, vomiting, or both.  Dizziness.  Pain with exposure to bright lights, loud noises, or activity.  General sensitivity to bright lights, loud noises, or smells. Before you get a migraine, you may get warning signs that a migraine is coming (aura). An aura may include:  Seeing flashing  lights.  Seeing bright spots, halos, or zigzag lines.  Having tunnel vision or blurred vision.  Having feelings of numbness or tingling.  Having trouble talking.  Having muscle weakness. DIAGNOSIS  A migraine headache is often diagnosed based on:  Symptoms.  Physical exam.  A CT scan or MRI of your head. These imaging tests cannot diagnose migraines, but they can help rule out other causes of headaches. TREATMENT Medicines may be given for pain and nausea. Medicines can also be given to help prevent recurrent migraines.  HOME CARE INSTRUCTIONS  Only take over-the-counter or prescription medicines for pain or discomfort as directed by your health care provider. The use of long-term narcotics is not recommended.  Lie down in a dark, quiet room when you have a migraine.  Keep a journal to find out what may trigger your migraine headaches. For example, write down:  What you eat and drink.  How much sleep you get.  Any change to your diet or medicines.  Limit alcohol consumption.  Quit smoking if you smoke.  Get 7-9 hours of sleep, or as recommended by your health care provider.  Limit stress.  Keep lights dim if bright lights bother you and make your migraines worse. SEEK IMMEDIATE MEDICAL CARE IF:   Your migraine becomes severe.  You have a fever.  You have a stiff neck.  You have vision loss.  You have muscular weakness or loss of muscle control.  You start losing your balance or have trouble walking.  You feel faint or pass out.  You have severe symptoms that are different from your first symptoms. MAKE SURE YOU:  Understand these instructions.  Will watch your condition.  Will get help right away if you are not doing well or get worse.   This information is not intended to replace advice given to you by your health care provider. Make sure you discuss any questions you have with your health care provider.   Document Released: 10/07/2005 Document  Revised: 10/28/2014 Document Reviewed: 06/14/2013 Elsevier Interactive Patient Education [employer]2016 [employer] Inc.

## 2016-05-22 ENCOUNTER — Emergency Department (HOSPITAL_COMMUNITY)
Admission: EM | Admit: 2016-05-22 | Discharge: 2016-05-23 | Disposition: A | Discharge status: Home / Self Care | Payer: BLUE CROSS/BLUE SHIELD | Attending: Emergency Medicine | Admitting: Emergency Medicine

## 2016-05-22 ENCOUNTER — Encounter (HOSPITAL_COMMUNITY): Payer: Self-pay | Admitting: Emergency Medicine

## 2016-05-22 DIAGNOSIS — G43809 Other migraine, not intractable, without status migrainosus: Secondary | ICD-10-CM | POA: Diagnosis not present

## 2016-05-22 DIAGNOSIS — Z791 Long term (current) use of non-steroidal anti-inflammatories (NSAID): Secondary | ICD-10-CM | POA: Diagnosis not present

## 2016-05-22 DIAGNOSIS — Z87891 Personal history of nicotine dependence: Secondary | ICD-10-CM | POA: Insufficient documentation

## 2016-05-22 DIAGNOSIS — J449 Chronic obstructive pulmonary disease, unspecified: Secondary | ICD-10-CM | POA: Insufficient documentation

## 2016-05-22 MED ORDER — KETOROLAC TROMETHAMINE 30 MG/ML IJ SOLN
15.0000 mg | Freq: Once | INTRAMUSCULAR | Status: AC
  Administered 2016-05-23: 15 mg via INTRAVENOUS
  Filled 2016-05-22: qty 1

## 2016-05-22 MED ORDER — PROCHLORPERAZINE EDISYLATE 5 MG/ML IJ SOLN
10.0000 mg | Freq: Once | INTRAMUSCULAR | Status: AC
  Administered 2016-05-23: 10 mg via INTRAVENOUS
  Filled 2016-05-22: qty 2

## 2016-05-22 MED ORDER — SODIUM CHLORIDE 0.9 % IV BOLUS (SEPSIS)
1000.0000 mL | Freq: Once | INTRAVENOUS | Status: AC
  Administered 2016-05-22: 1000 mL via INTRAVENOUS

## 2016-05-22 MED ORDER — DIPHENHYDRAMINE HCL 50 MG/ML IJ SOLN
25.0000 mg | Freq: Once | INTRAMUSCULAR | Status: AC
  Administered 2016-05-22: 25 mg via INTRAVENOUS
  Filled 2016-05-22: qty 1

## 2016-05-22 NOTE — ED Triage Notes (Signed)
Patient reports HA, photophobia, nausea and neck pain since 0900 today. Denies fever, double or blurred vision, emesis, or nuchal rigidity.

## 2016-05-22 NOTE — ED Provider Notes (Signed)
[facility]-EMERGENCY DEPT Provider Note   CSN: [id number] Arrival date & time: 05/22/16  2227  First Provider Contact:   First MD Initiated Contact with Patient 05/22/16 2333    By signing my name below, I, Arianna Nassar, attest that this documentation has been prepared under the direction and in the presence of Shon Baton, MD.  Electronically Signed: Octavia Heir, ED Scribe. 05/22/16. 11:43 PM.    History   Chief Complaint Chief Complaint  Patient presents with  . Migraine  . Nausea   The history is provided by the patient. No language interpreter was used.   HPI Comments: Mitchell Mccoy is a 30 y.o. male who presents to the Emergency Department complaining of gradual onset, gradual worsening, intermittent, throbbing and aching frontal headache onset this morning around 0900. He notes associated photophobia, nausea  and neck pain. Pt has a hx of migraines but notes this currently different due to the location. He notes taking ibuprofen to alleviate his pain with no relief. Denies neck stiffness, emesis, fever or visual changes.  Past Medical History:  Diagnosis Date  . Back pain, chronic   . Chronic pain of left knee   . COPD (chronic obstructive pulmonary disease) (HCC)   . Meniscus tear     There are no active problems to display for this patient.   Past Surgical History:  Procedure Laterality Date  . HAND DEBRIDEMENT    . KNEE SURGERY Left 2006   meniscus tear repair  . TONSILLECTOMY         Home Medications    Prior to Admission medications   Medication Sig Start Date End Date Taking? Authorizing Provider  ibuprofen (ADVIL,MOTRIN) 200 MG tablet Take 400 mg by mouth every 6 (six) hours as needed for moderate pain.   Yes Historical Provider, MD  Capsaicin-Menthol-Methyl Sal (CAPSAICIN-METHYL SAL-MENTHOL) 0.025-1-12 % CREA Apply 1 application topically 2 (two) times daily. Patient not taking: Reported on 05/22/2016 06/25/15   Lyndal Pulley, MD  diclofenac  sodium (VOLTAREN) 1 % GEL Apply 2 g topically 4 (four) times daily. Patient not taking: Reported on 05/22/2016 06/25/15   Lyndal Pulley, MD  HYDROcodone-acetaminophen (NORCO/VICODIN) 5-325 MG tablet Take 1-2 tablets by mouth every 6 (six) hours as needed (for pain). Patient not taking: Reported on 05/22/2016 09/11/15   Paula Libra, MD  ibuprofen (ADVIL,MOTRIN) 800 MG tablet Take 1 tablet (800 mg total) by mouth every 6 (six) hours. Patient not taking: Reported on 05/22/2016 06/25/15   Lyndal Pulley, MD    Family History No family history on file.  Social History Social History  Substance Use Topics  . Smoking status: Former Smoker    Packs/day: 1.00    Years: 15.00    Types: Cigarettes    Quit date: 04/25/2013  . Smokeless tobacco: Former Neurosurgeon  . Alcohol use Yes     Comment: occasional      Allergies   Review of patient's allergies indicates no known allergies.   Review of Systems Review of Systems  Constitutional: Negative for fever.  Eyes: Positive for photophobia. Negative for visual disturbance.  Gastrointestinal: Positive for nausea. Negative for vomiting.  Musculoskeletal: Positive for neck pain. Negative for neck stiffness.  All other systems reviewed and are negative.    Physical Exam Updated Vital Signs BP 106/63 (BP Location: Left Arm)   Pulse 80   Temp 98.1 F (36.7 C) (Oral)   Resp 15   SpO2 97%   Physical Exam  Constitutional: He is oriented to  person, place, and time. He appears well-developed and well-nourished. No distress.  HENT:  Head: Normocephalic and atraumatic.  Eyes: EOM are normal. Pupils are equal, round, and reactive to light.  Neck: Normal range of motion. Neck supple.  Cardiovascular: Normal rate, regular rhythm and normal heart sounds.   No murmur heard. Pulmonary/Chest: Effort normal and breath sounds normal. No respiratory distress. He has no wheezes.  Abdominal: Soft. Bowel sounds are normal. There is no tenderness. There is no rebound.    Musculoskeletal: He exhibits no edema.  Lymphadenopathy:    He has no cervical adenopathy.  Neurological: He is alert and oriented to person, place, and time.  Cranial nerves II through XII intact, fluent speech, no dysmetria to finger-nose-finger, 5 out of 5 strength in all 4 extremities  Skin: Skin is warm and dry.  Psychiatric: He has a normal mood and affect.  Nursing note and vitals reviewed.    ED Treatments / Results  DIAGNOSTIC STUDIES: Oxygen Saturation is 98% on RA, normal by my interpretation.  COORDINATION OF CARE:  11:34 PM Discussed treatment plan with pt at bedside and pt agreed to plan.  Labs (all labs ordered are listed, but only abnormal results are displayed) Labs Reviewed - No data to display  EKG  EKG Interpretation None       Radiology No results found.  Procedures Procedures (including critical care time)  Medications Ordered in ED Medications  sodium chloride 0.9 % bolus 1,000 mL (0 mLs Intravenous Stopped 05/23/16 0100)  prochlorperazine (COMPAZINE) injection 10 mg (10 mg Intravenous Given 05/23/16 0001)  diphenhydrAMINE (BENADRYL) injection 25 mg (25 mg Intravenous Given 05/22/16 2359)  ketorolac (TORADOL) 30 MG/ML injection 15 mg (15 mg Intravenous Given 05/23/16 0000)     Initial Impression / Assessment and Plan / ED Course  I have reviewed the triage vital signs and the nursing notes.  Pertinent labs & imaging results that were available during my care of the patient were reviewed by me and considered in my medical decision making (see chart for details).  Clinical Course   Patient presents with headache. History of migraines. Nonfocal on exam. No signs or symptoms suggestive of subarachnoid hemorrhage or meningitis. Patient was given migraine cocktail.  1:34 AM Patient resting comfortably. Headache is now improved.  After history, exam, and medical workup I feel the patient has been appropriately medically screened and is safe for  discharge home. Pertinent diagnoses were discussed with the patient. Patient was given return precautions.   Final Clinical Impressions(s) / ED Diagnoses   Final diagnoses:  Other migraine without status migrainosus, not intractable   I personally performed the services described in this documentation, which was scribed in my presence. The recorded information has been reviewed and is accurate.   New Prescriptions New Prescriptions   No medications on file     Shon Baton, MD 05/23/16 [phone]

## 2016-05-23 NOTE — ED Notes (Signed)
Pt ambulated to restroom. 

## 2016-09-30 ENCOUNTER — Emergency Department (HOSPITAL_COMMUNITY)
Admission: EM | Admit: 2016-09-30 | Discharge: 2016-10-01 | Disposition: A | Discharge status: Home / Self Care | Payer: BLUE CROSS/BLUE SHIELD | Attending: Emergency Medicine | Admitting: Emergency Medicine

## 2016-09-30 ENCOUNTER — Encounter (HOSPITAL_COMMUNITY): Payer: Self-pay

## 2016-09-30 DIAGNOSIS — J40 Bronchitis, not specified as acute or chronic: Secondary | ICD-10-CM | POA: Diagnosis not present

## 2016-09-30 DIAGNOSIS — J449 Chronic obstructive pulmonary disease, unspecified: Secondary | ICD-10-CM | POA: Insufficient documentation

## 2016-09-30 DIAGNOSIS — Z87891 Personal history of nicotine dependence: Secondary | ICD-10-CM | POA: Insufficient documentation

## 2016-09-30 DIAGNOSIS — Z79899 Other long term (current) drug therapy: Secondary | ICD-10-CM | POA: Insufficient documentation

## 2016-09-30 DIAGNOSIS — R05 Cough: Secondary | ICD-10-CM | POA: Diagnosis present

## 2016-09-30 NOTE — ED Triage Notes (Signed)
Pt complains of a persistent cough for a few days, it's a productive cough and now he states he's seen blood

## 2016-10-01 ENCOUNTER — Emergency Department (HOSPITAL_COMMUNITY): Payer: BLUE CROSS/BLUE SHIELD

## 2016-10-01 MED ORDER — BENZONATATE 100 MG PO CAPS: 100.0000 mg | ORAL_CAPSULE | Freq: Three times a day (TID) | ORAL | 0 refills | Status: AC

## 2016-10-01 MED ORDER — ALBUTEROL SULFATE HFA 108 (90 BASE) MCG/ACT IN AERS
2.0000 | INHALATION_SPRAY | Freq: Once | RESPIRATORY_TRACT | Status: AC
  Administered 2016-10-01: 2 via RESPIRATORY_TRACT
  Filled 2016-10-01: qty 6.7

## 2016-10-01 NOTE — Discharge Instructions (Signed)
Inhaler 2 puffs every 4 hrs for cough and shortness of breath. Take tessalon for cough as prescribed as needed. You can also take robitussin over the counter for cough. Follow up with family doctor, return if worsening.

## 2016-10-01 NOTE — ED Provider Notes (Signed)
[facility]-EMERGENCY DEPT Provider Note   CSN: [id number] Arrival date & time: 09/30/16  2254     History   Chief Complaint Chief Complaint  Patient presents with  . Cough    HPI Mitchell Mccoy is a 30 y.o. male.  HPI Mitchell Mccoy is a 30 y.o. male presents to emergency department complaining of cough. Patient states he has had cough and congestion for the last week. States yesterday he had some blood taste in his mouth. He denies seeing blood in his sputum but states he did not check. He denies any fever or chills. No sore throat. Not taking any medications for his symptoms. Patient is a smoker. Denies any chest pain. Reports some shortness of breath when coughing. Denies recent travel or surgeries. No sick contacts.  Past Medical History:  Diagnosis Date  . Back pain, chronic   . Chronic pain of left knee   . COPD (chronic obstructive pulmonary disease) (HCC)   . Meniscus tear     There are no active problems to display for this patient.   Past Surgical History:  Procedure Laterality Date  . HAND DEBRIDEMENT    . KNEE SURGERY Left 2006   meniscus tear repair  . TONSILLECTOMY         Home Medications    Prior to Admission medications   Medication Sig Start Date End Date Taking? Authorizing Provider  benzonatate (TESSALON) 100 MG capsule Take 1 capsule (100 mg total) by mouth every 8 (eight) hours. 10/01/16   Leaira Fullam, PA-C  Capsaicin-Menthol-Methyl Sal (CAPSAICIN-METHYL SAL-MENTHOL) 0.025-1-12 % CREA Apply 1 application topically 2 (two) times daily. Patient not taking: Reported on 05/22/2016 06/25/15   Lyndal Pulleyaniel Knott, MD  diclofenac sodium (VOLTAREN) 1 % GEL Apply 2 g topically 4 (four) times daily. Patient not taking: Reported on 05/22/2016 06/25/15   Lyndal Pulleyaniel Knott, MD  HYDROcodone-acetaminophen (NORCO/VICODIN) 5-325 MG tablet Take 1-2 tablets by mouth every 6 (six) hours as needed (for pain). Patient not taking: Reported on 05/22/2016 09/11/15   Paula LibraJohn  Molpus, MD  ibuprofen (ADVIL,MOTRIN) 200 MG tablet Take 400 mg by mouth every 6 (six) hours as needed for moderate pain.    Historical Provider, MD  ibuprofen (ADVIL,MOTRIN) 800 MG tablet Take 1 tablet (800 mg total) by mouth every 6 (six) hours. Patient not taking: Reported on 05/22/2016 06/25/15   Lyndal Pulleyaniel Knott, MD    Family History History reviewed. No pertinent family history.  Social History Social History  Substance Use Topics  . Smoking status: Former Smoker    Packs/day: 1.00    Years: 15.00    Types: Cigarettes    Quit date: 04/25/2013  . Smokeless tobacco: Former NeurosurgeonUser  . Alcohol use Yes     Comment: occasional      Allergies   Patient has no known allergies.   Review of Systems Review of Systems  Constitutional: Negative for chills and fever.  HENT: Positive for congestion.   Respiratory: Positive for cough. Negative for chest tightness and shortness of breath.   Cardiovascular: Negative for chest pain, palpitations and leg swelling.  Gastrointestinal: Negative for abdominal distention, abdominal pain, diarrhea, nausea and vomiting.  Musculoskeletal: Negative for arthralgias, myalgias, neck pain and neck stiffness.  Skin: Negative for rash.  Allergic/Immunologic: Negative for immunocompromised state.  Neurological: Negative for dizziness, weakness, light-headedness, numbness and headaches.  All other systems reviewed and are negative.    Physical Exam Updated Vital Signs BP 131/88 (BP Location: Left Arm)   Pulse 94  Temp 98.5 F (36.9 C) (Oral)   Resp 20   SpO2 99%   Physical Exam  Constitutional: He appears well-developed and well-nourished. No distress.  HENT:  Head: Normocephalic and atraumatic.  Eyes: Conjunctivae are normal.  Neck: Neck supple.  Cardiovascular: Normal rate, regular rhythm and normal heart sounds.   Pulmonary/Chest: Effort normal and breath sounds normal. No respiratory distress. He has no wheezes. He has no rales.  Abdominal: Soft.  Bowel sounds are normal. He exhibits no distension. There is no tenderness. There is no rebound.  Musculoskeletal: He exhibits no edema.  Neurological: He is alert.  Skin: Skin is warm and dry.  Nursing note and vitals reviewed.    ED Treatments / Results  Labs (all labs ordered are listed, but only abnormal results are displayed) Labs Reviewed - No data to display  EKG  EKG Interpretation None       Radiology Dg Chest 2 View  Result Date: 10/01/2016 CLINICAL DATA:  30 y/o  M; 1 week of cough and hemoptysis. EXAM: CHEST  2 VIEW COMPARISON:  02/20/2014 chest radiograph. FINDINGS: The heart size and mediastinal contours are within normal limits and stable. Both lungs are clear. The visualized skeletal structures are unremarkable. IMPRESSION: No active cardiopulmonary disease. Electronically Signed   By: Mitzi HansenLance  Furusawa-Stratton M.D.   On: 10/01/2016 01:02    Procedures Procedures (including critical care time)  Medications Ordered in ED Medications  albuterol (PROVENTIL HFA;VENTOLIN HFA) 108 (90 Base) MCG/ACT inhaler 2 puff (not administered)     Initial Impression / Assessment and Plan / ED Course  I have reviewed the triage vital signs and the nursing notes.  Pertinent labs & imaging results that were available during my care of the patient were reviewed by me and considered in my medical decision making (see chart for details).  Clinical Course     Patient with cough and congestion for a week. Vital signs are normal. Oxygen saturation 99% on room air. Not tachycardic, not tachypneic. Doubt PE. Chest x-ray obtained and is negative. Patient is a smoker. Advised to stop smoking. Plan to discharge home with the albuterol inhaler, over-the-counter cough medications, Tessalon. Follow with primary care doctor. Most likely viral acute bronchitis.  Final Clinical Impressions(s) / ED Diagnoses   Final diagnoses:  Bronchitis    New Prescriptions New Prescriptions    BENZONATATE (TESSALON) 100 MG CAPSULE    Take 1 capsule (100 mg total) by mouth every 8 (eight) hours.     Jaynie Crumbleatyana Tiari Andringa, PA-C 10/01/16 0120    Pricilla LovelessScott Goldston, MD 10/01/16 [phone]

## 2016-10-01 NOTE — ED Notes (Signed)
Pt to xray

## 2017-01-15 IMAGING — CT CT HEAD W/O CM
1 series · 16 of 30 positions shown, 20 images · non-contrast
Comparison: Brain MRI 05/09/2013

CLINICAL DATA: Syncope.  Severe frontal headache.

EXAM:
CT HEAD WITHOUT CONTRAST
TECHNIQUE: Contiguous axial images were obtained from the base of the skull
through the vertex without intravenous contrast.

[Series 2: head wo · axial · 0.46mm/px · z∈[-134,-3]mm · 16 of 30 slices shown, 20 images]
[im 2/30  brain]
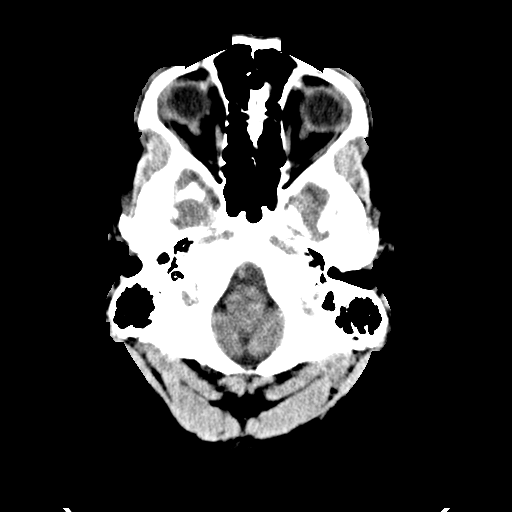
[im 2/30  bone]
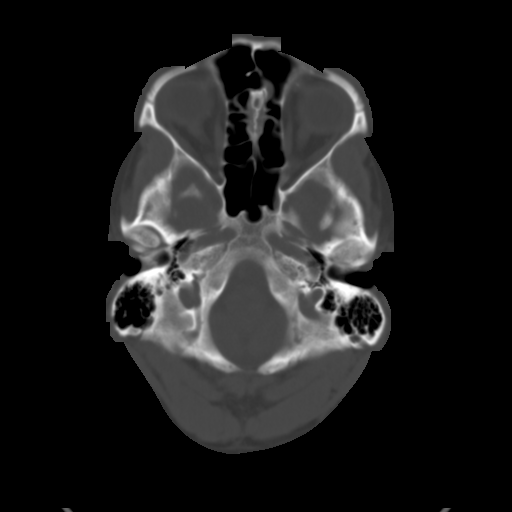
[im 4/30  brain]
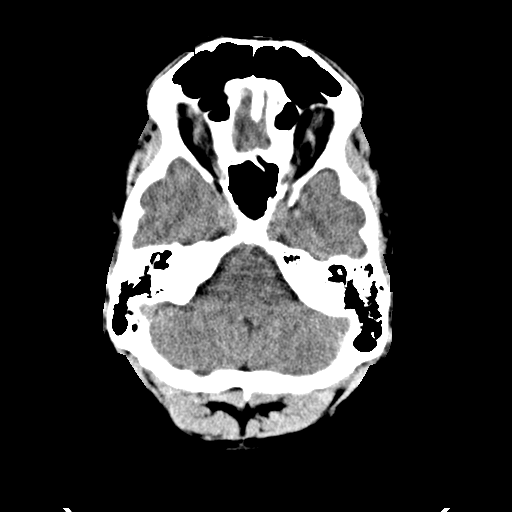
[im 6/30  brain]
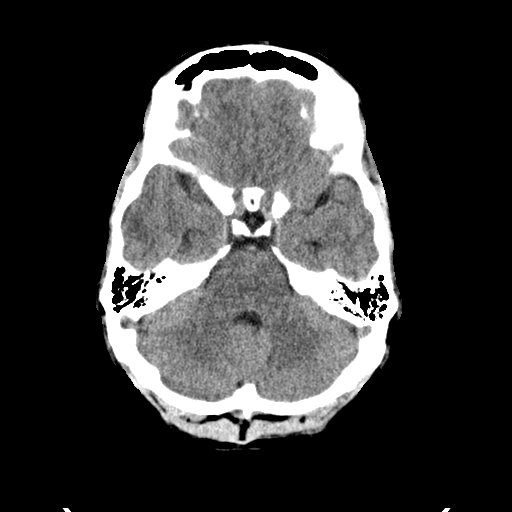
[im 8/30  brain]
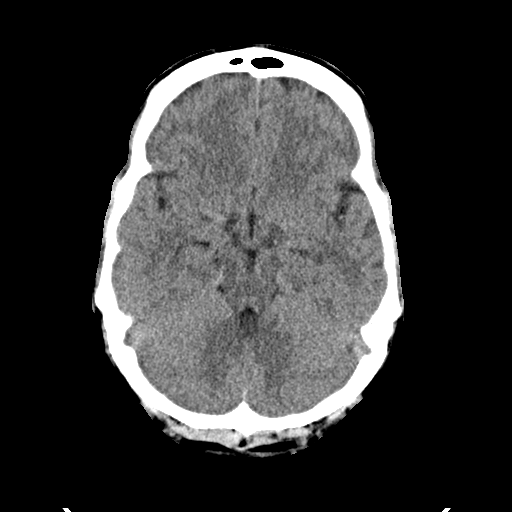
[im 9/30  brain]
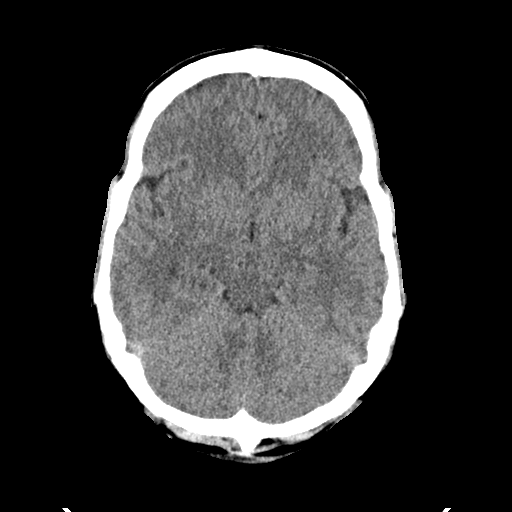
[im 9/30  bone]
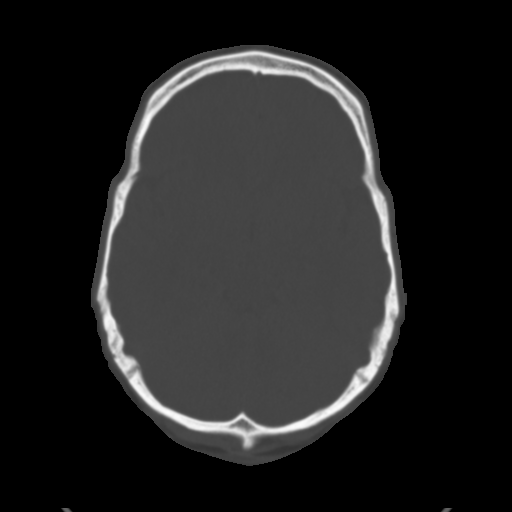
[im 11/30  brain]
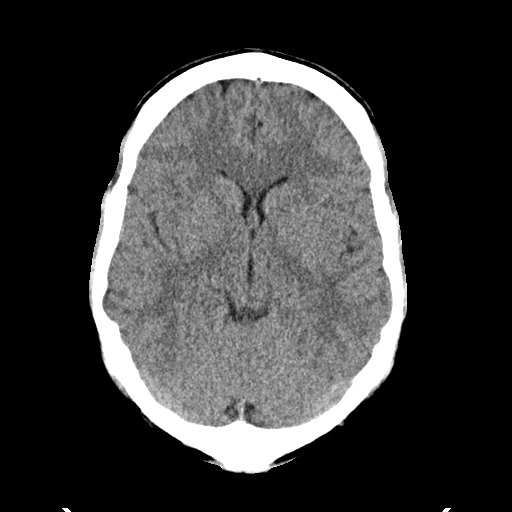
[im 13/30  brain]
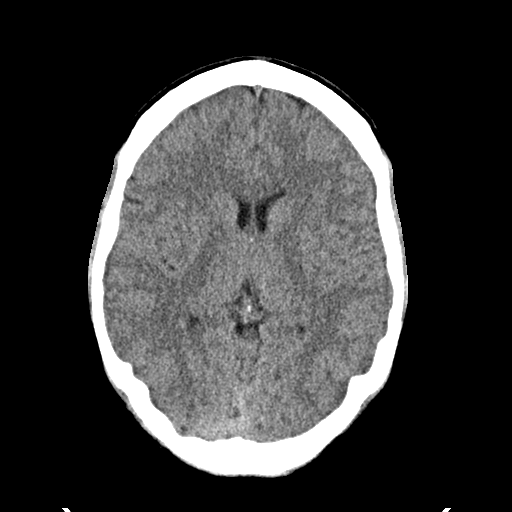
[im 15/30  brain]
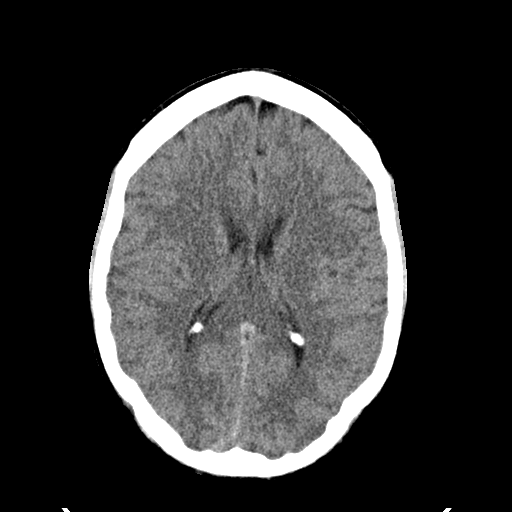
[im 16/30  brain]
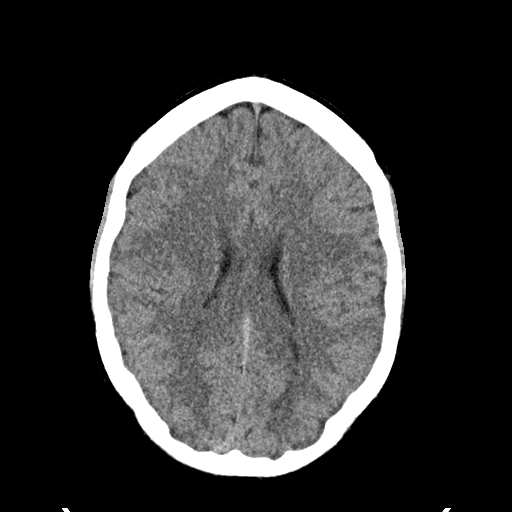
[im 16/30  bone]
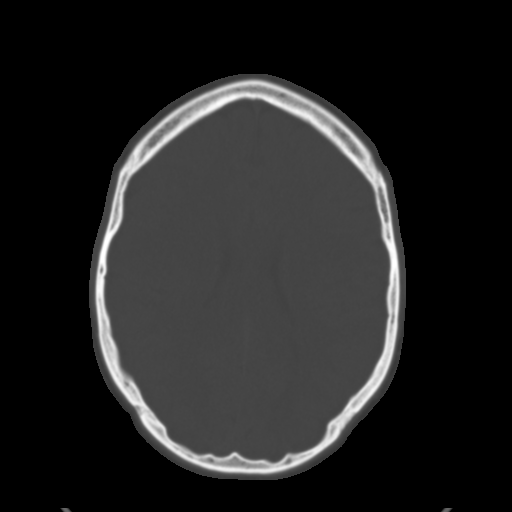
[im 18/30  brain]
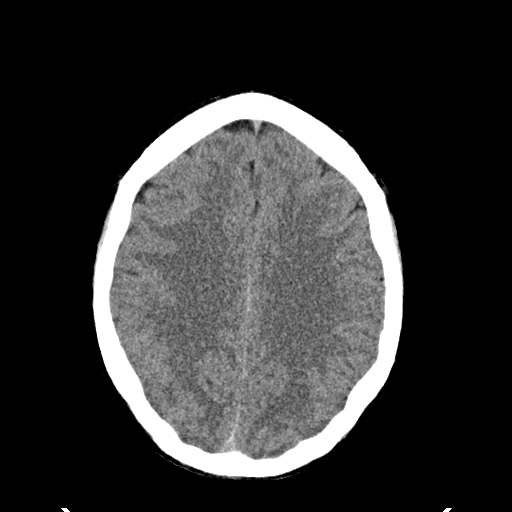
[im 20/30  brain]
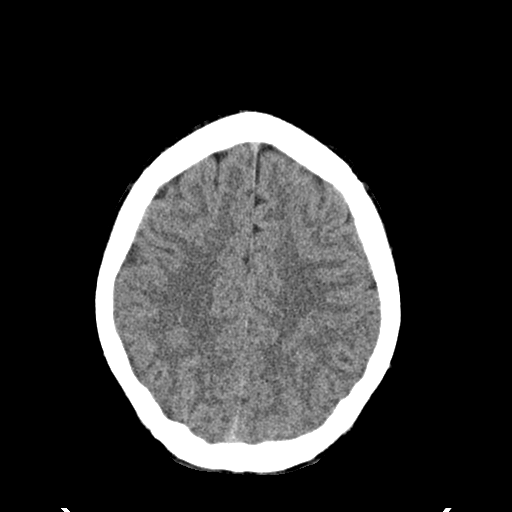
[im 22/30  brain]
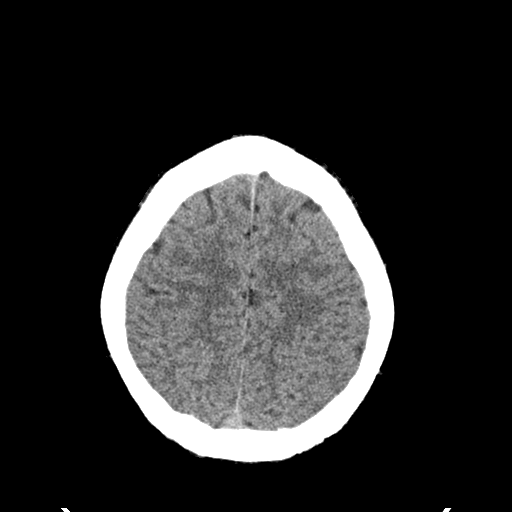
[im 23/30  brain]
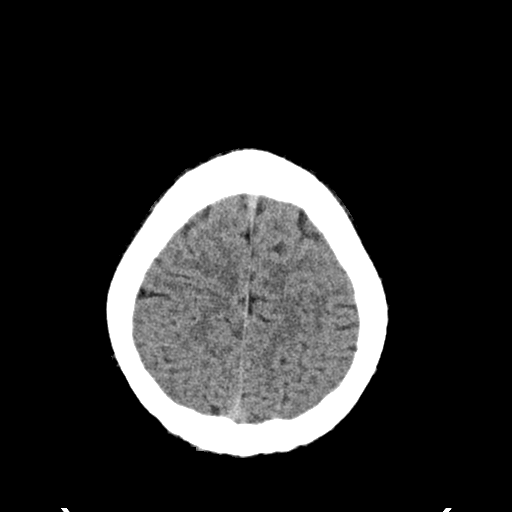
[im 23/30  bone]
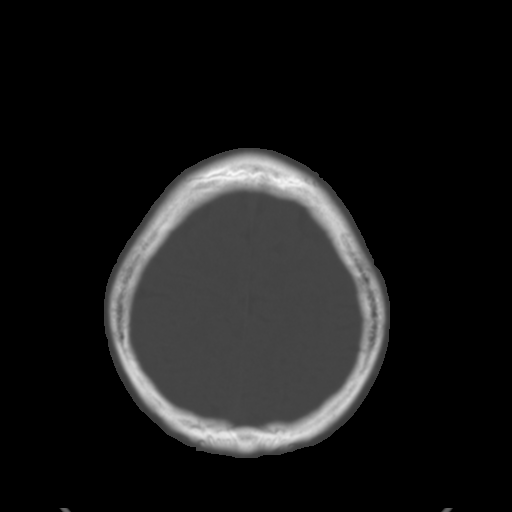
[im 25/30  brain]
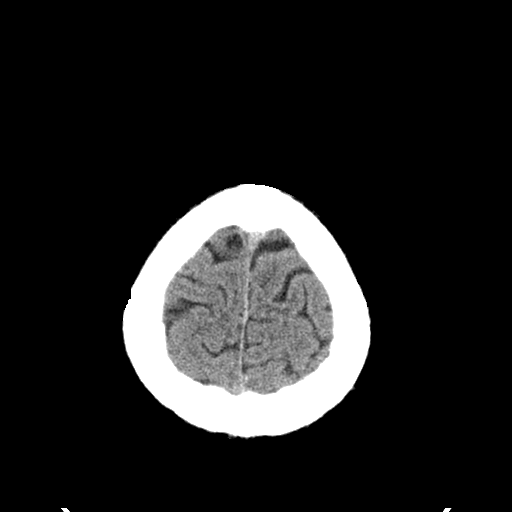
[im 27/30  brain]
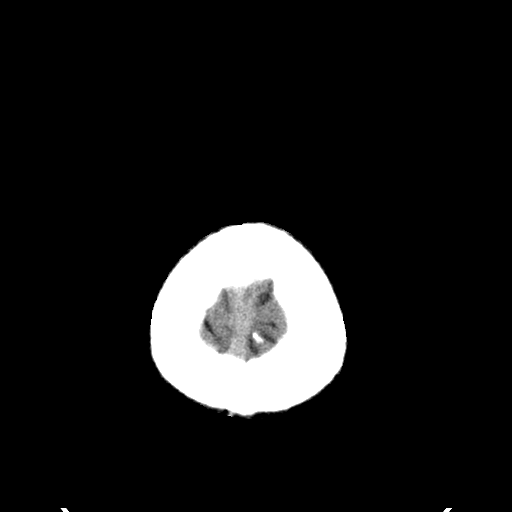
[im 29/30  brain]
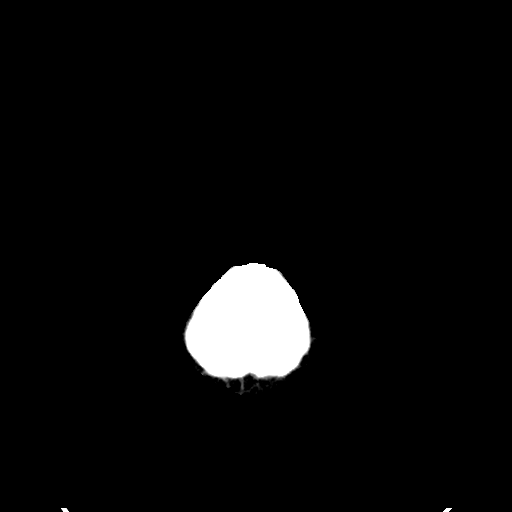

[16 of 30 positions shown; findings below may reference images not displayed]

FINDINGS: Brain: No evidence of acute infarction, hemorrhage, extra-axial
collection, ventriculomegaly, or mass effect.

Vascular: No hyperdense vessel or unexpected calcification.

Skull: Negative for fracture or focal lesion.

Sinuses/Orbits: Minimal mucosal thickening left side of sphenoid
sinus. Paranasal sinuses otherwise clear.

Other: None.
IMPRESSION: No acute intracranial abnormality.

## 2017-09-22 IMAGING — CR DG CHEST 2V
2 series · 2 of 2 positions shown · non-contrast
Comparison: 02/20/2014 chest radiograph.

CLINICAL DATA: 30 y/o  M; 1 week of cough and hemoptysis.

EXAM:
CHEST  2 VIEW

[w chest pa]
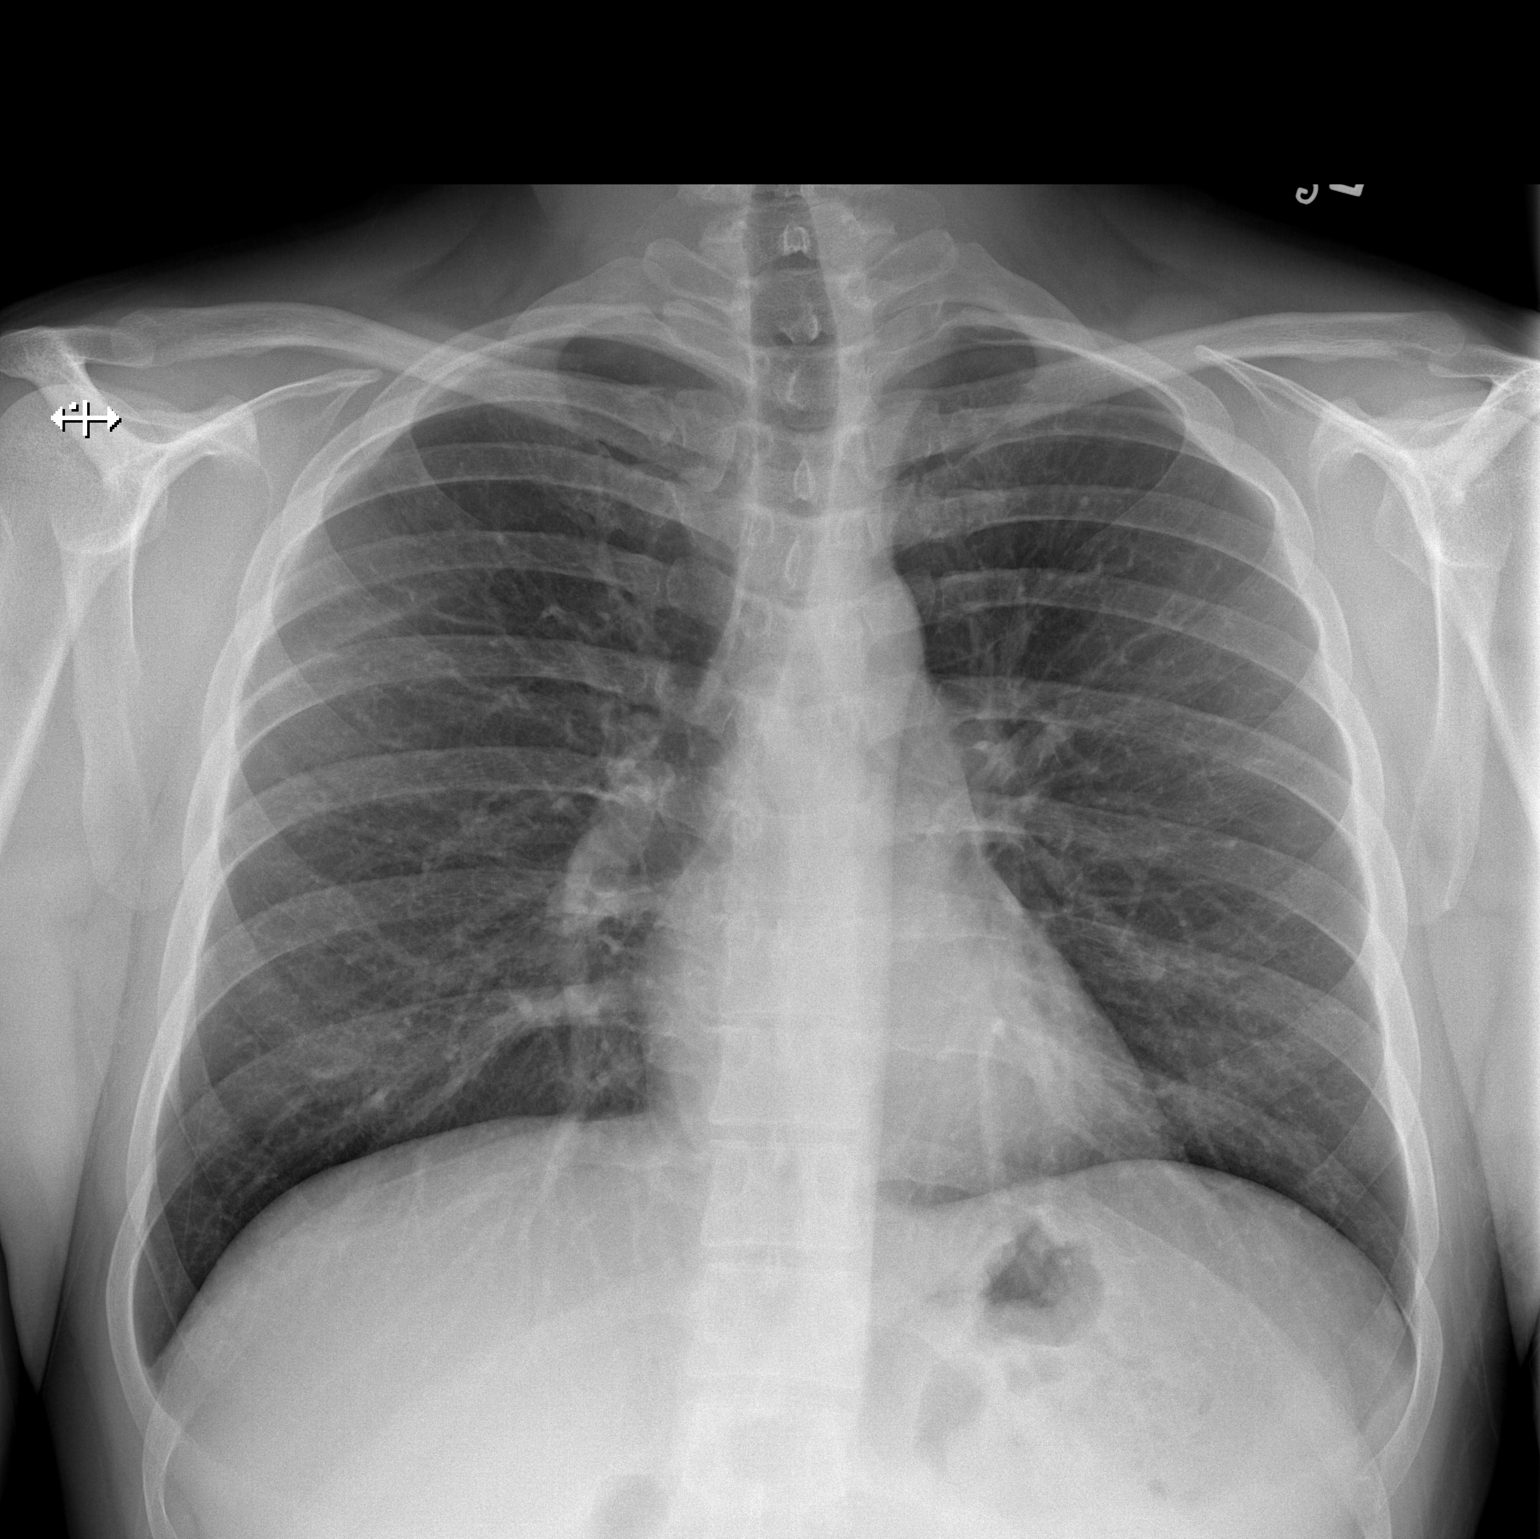

[w chest lat]
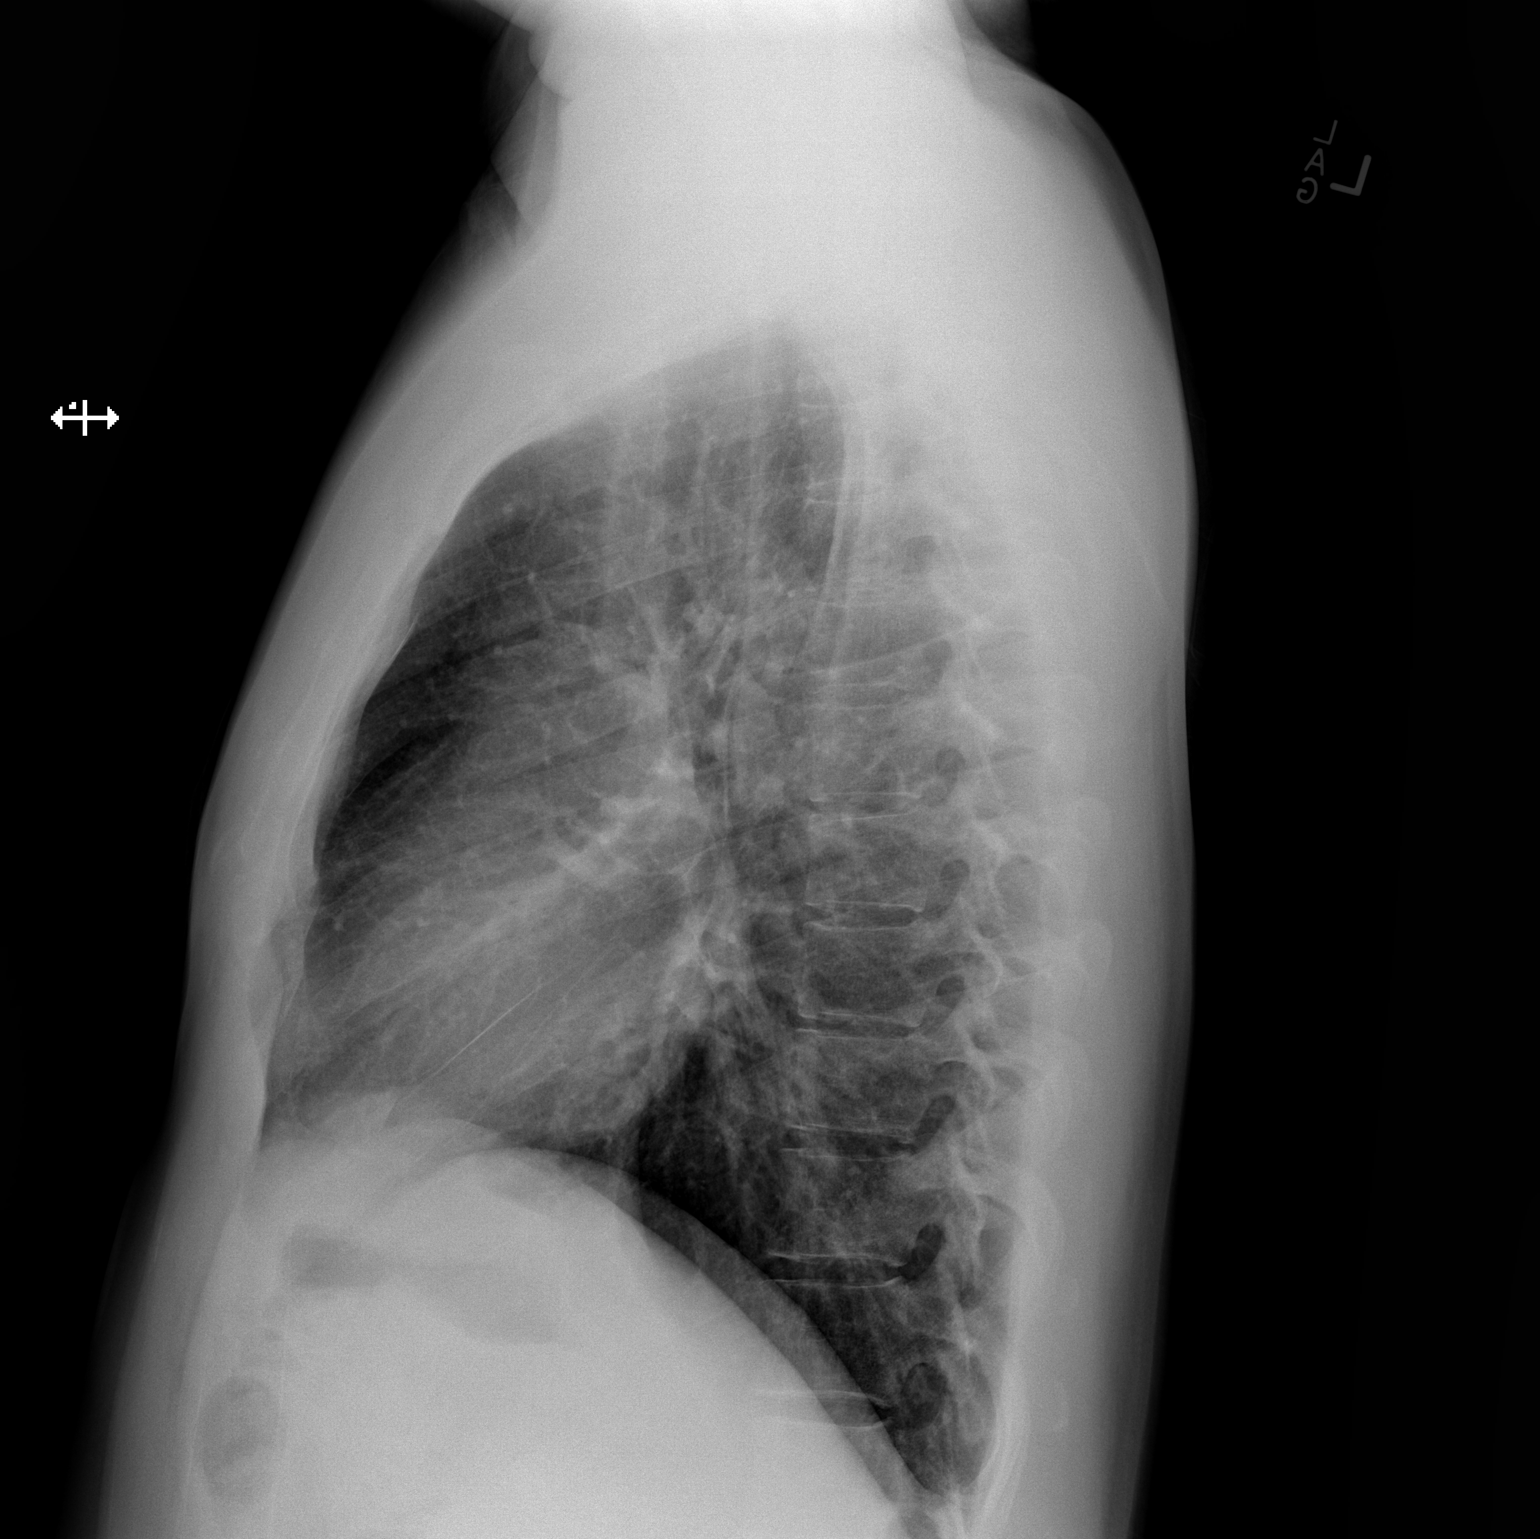

[2 of 2 positions shown; findings below may reference images not displayed]

FINDINGS: The heart size and mediastinal contours are within normal limits and
stable. Both lungs are clear. The visualized skeletal structures are
unremarkable.
IMPRESSION: No active cardiopulmonary disease.

By: Brischitt Pommeranz M.D.
